# Patient Record
Sex: Female | Born: 1955 | Race: Black or African American | Hispanic: No | Marital: Single | Smoking: Never smoker
Health system: Southern US, Community
[De-identification: ages and names within clinical notes are randomized; demographics above are authoritative.]

## PROBLEM LIST (undated history)

## (undated) DIAGNOSIS — I10 Essential (primary) hypertension: Secondary | ICD-10-CM

## (undated) DIAGNOSIS — I471 Supraventricular tachycardia: Secondary | ICD-10-CM

## (undated) HISTORY — PX: BREAST SURGERY: SHX581

---

## 2017-05-28 ENCOUNTER — Emergency Department (HOSPITAL_BASED_OUTPATIENT_CLINIC_OR_DEPARTMENT_OTHER): Payer: BC Managed Care – PPO

## 2017-05-28 ENCOUNTER — Other Ambulatory Visit: Payer: Self-pay

## 2017-05-28 ENCOUNTER — Emergency Department (HOSPITAL_BASED_OUTPATIENT_CLINIC_OR_DEPARTMENT_OTHER)
Admission: EM | Admit: 2017-05-28 | Discharge: 2017-05-28 | Disposition: A | Discharge status: Home / Self Care | Payer: BC Managed Care – PPO | Attending: Emergency Medicine | Admitting: Emergency Medicine

## 2017-05-28 ENCOUNTER — Encounter (HOSPITAL_BASED_OUTPATIENT_CLINIC_OR_DEPARTMENT_OTHER): Payer: Self-pay | Admitting: *Deleted

## 2017-05-28 DIAGNOSIS — I10 Essential (primary) hypertension: Secondary | ICD-10-CM | POA: Insufficient documentation

## 2017-05-28 DIAGNOSIS — J111 Influenza due to unidentified influenza virus with other respiratory manifestations: Secondary | ICD-10-CM | POA: Insufficient documentation

## 2017-05-28 DIAGNOSIS — R05 Cough: Secondary | ICD-10-CM | POA: Diagnosis present

## 2017-05-28 DIAGNOSIS — R51 Headache: Secondary | ICD-10-CM | POA: Insufficient documentation

## 2017-05-28 HISTORY — DX: Essential (primary) hypertension: I10

## 2017-05-28 HISTORY — DX: Supraventricular tachycardia: I47.1

## 2017-05-28 LAB — INFLUENZA PANEL BY PCR (TYPE A & B)
INFLBPCR: NEGATIVE
Influenza A By PCR: POSITIVE — AB

## 2017-05-28 IMAGING — DX DG CHEST 2V
2 series · 2 of 2 positions shown · non-contrast
Comparison: None.

CLINICAL DATA: Cough, fever, and nausea and vomiting for 2 days.

EXAM:
CHEST  2 VIEW

[chest lat]
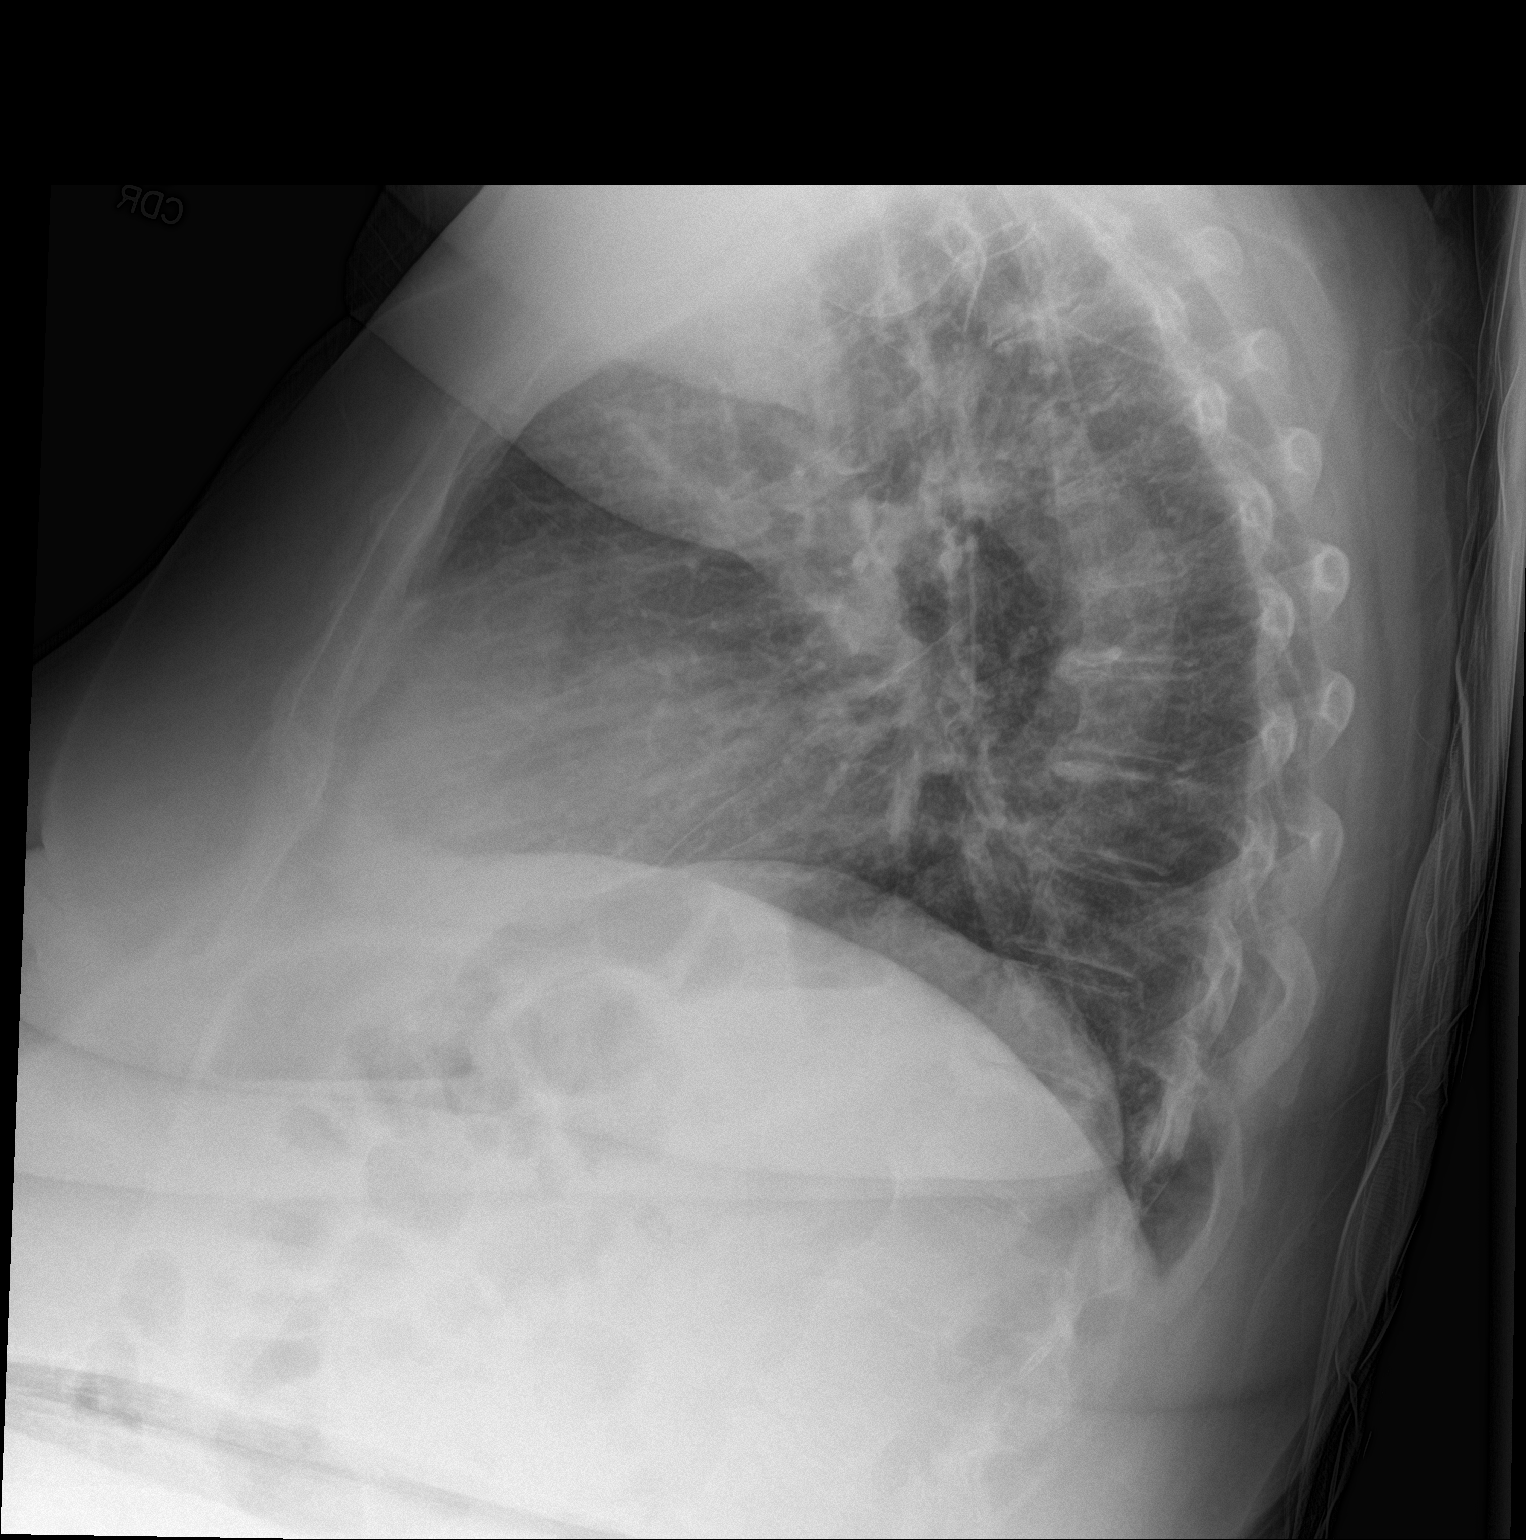

[chest ap strecther]
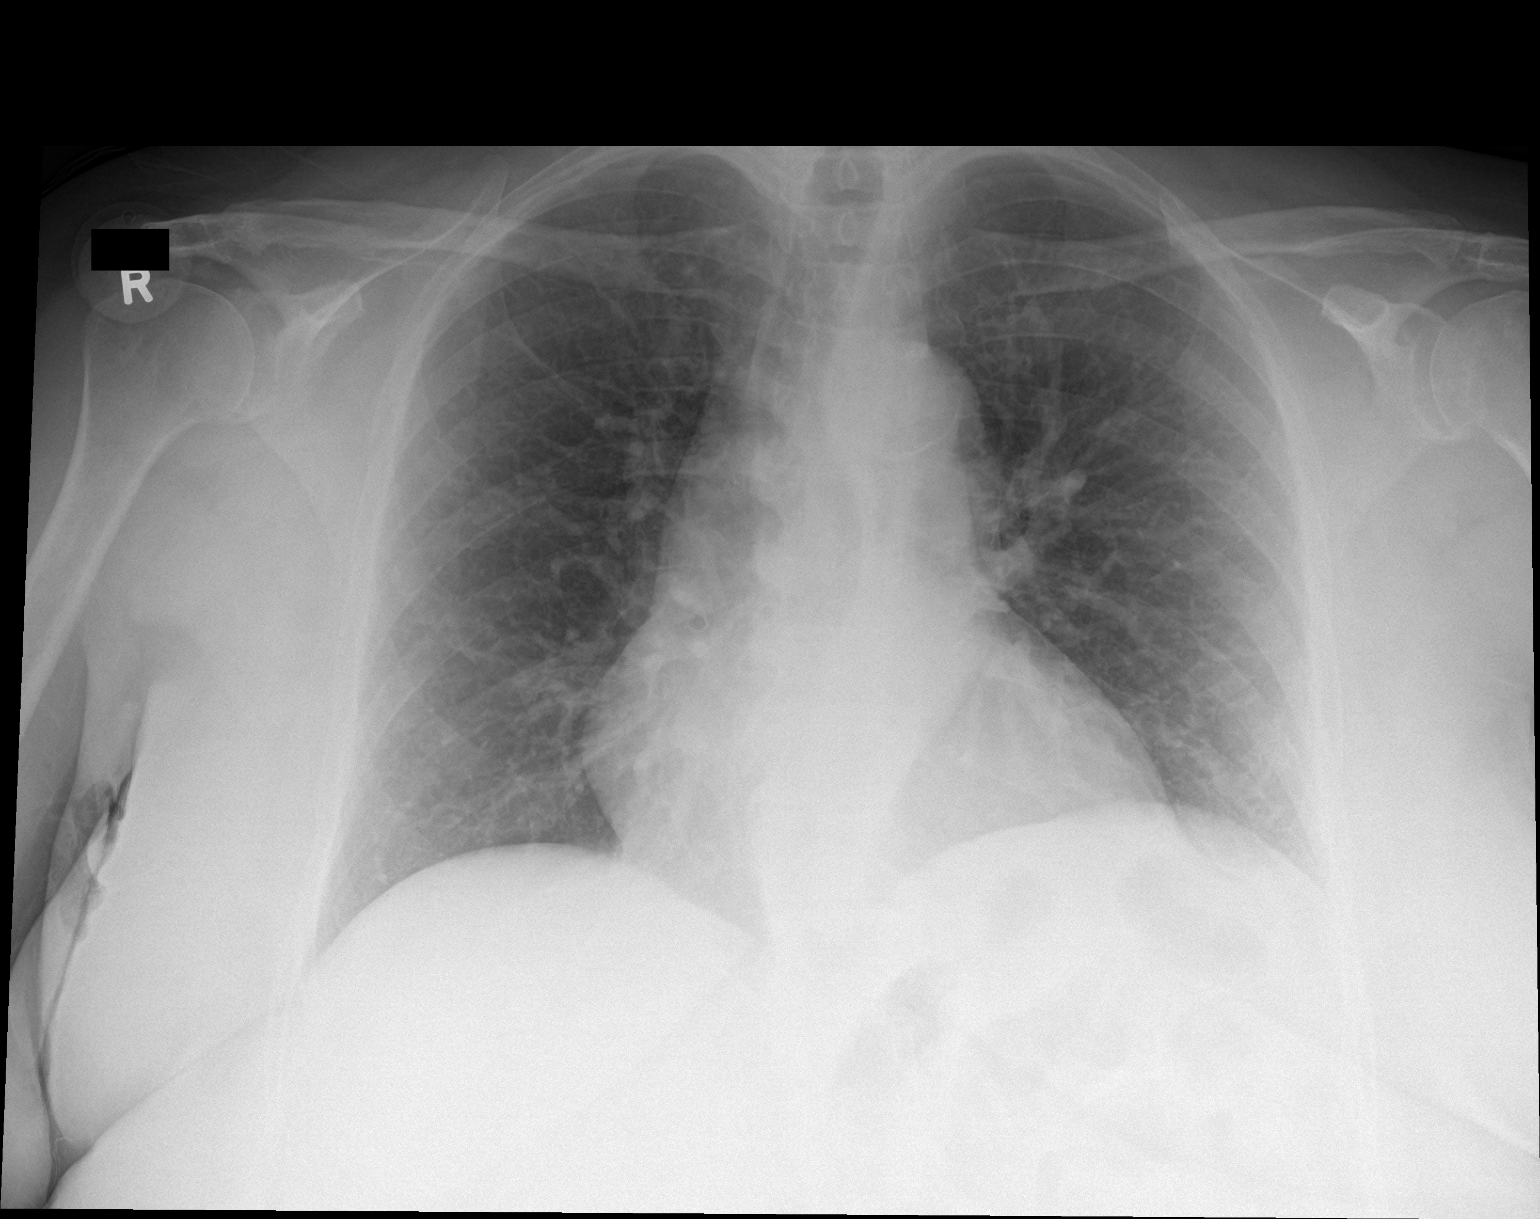

[2 of 2 positions shown; findings below may reference images not displayed]

FINDINGS: The heart size is normal. Mild tortuosity and atherosclerotic
calcification of thoracic aorta noted. Both lungs are clear. Mild
thoracic spine degenerative changes.
IMPRESSION: No active cardiopulmonary disease.

## 2017-05-28 IMAGING — CT CT HEAD W/O CM
3 series · 14 of 47 positions shown, 16 images · non-contrast
Comparison: None.

CLINICAL DATA: Headaches

EXAM:
CT HEAD WITHOUT CONTRAST
TECHNIQUE: Contiguous axial images were obtained from the base of the skull
through the vertex without intravenous contrast.

[Series 2: head wo · axial · 0.44mm/px · z∈[-167,-42]mm · 8 of 30 slices shown, 10 images]
[im 3/30  brain]
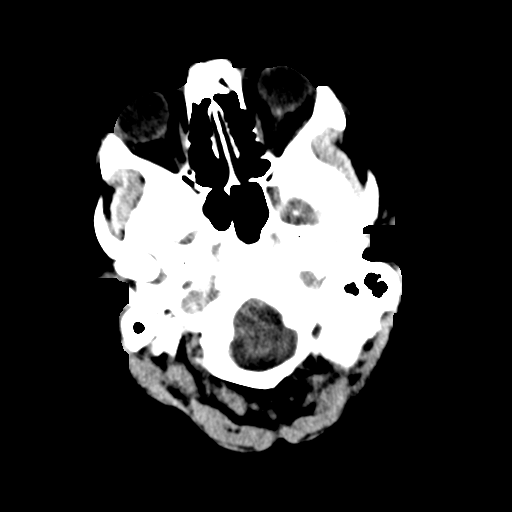
[im 3/30  bone]
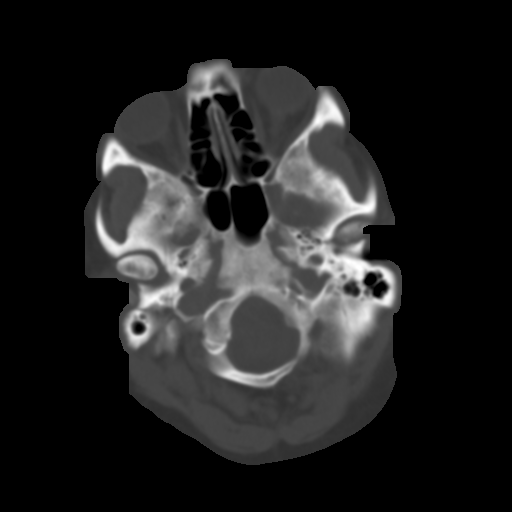
[im 7/30  brain]
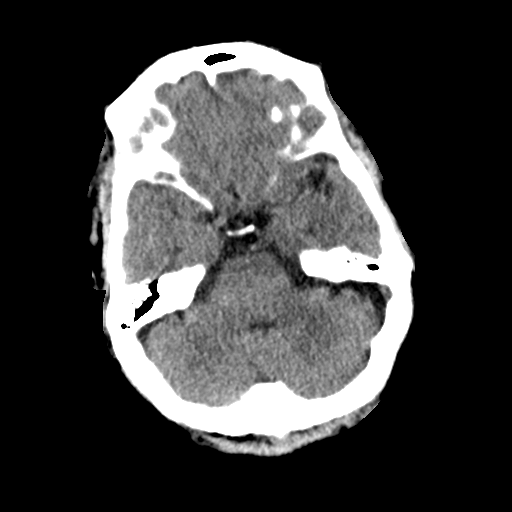
[im 10/30  brain]
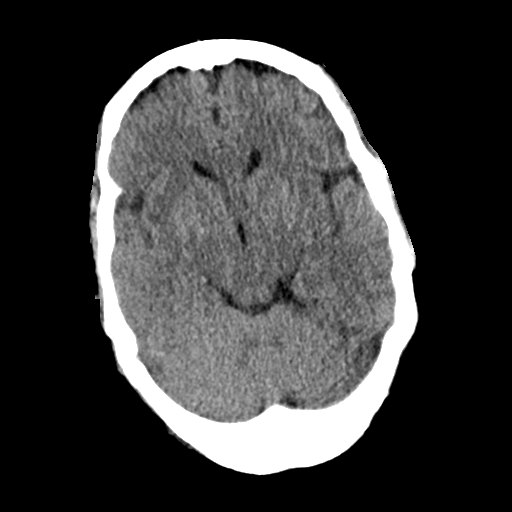
[im 14/30  brain]
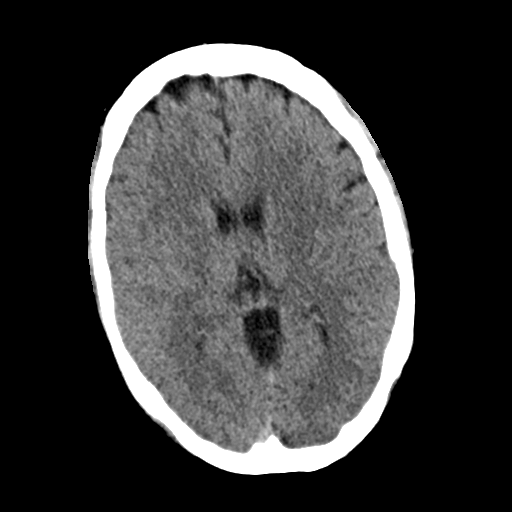
[im 17/30  brain]
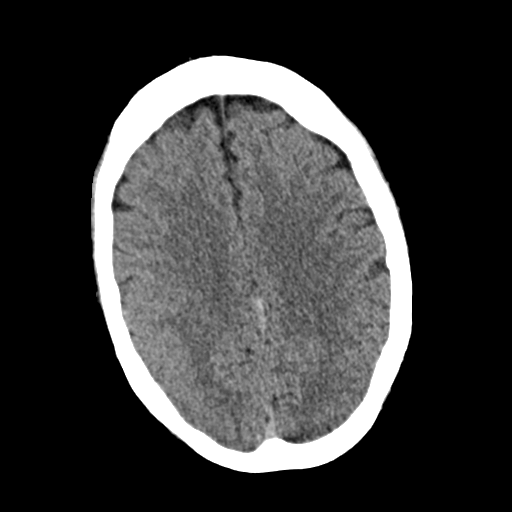
[im 17/30  bone]
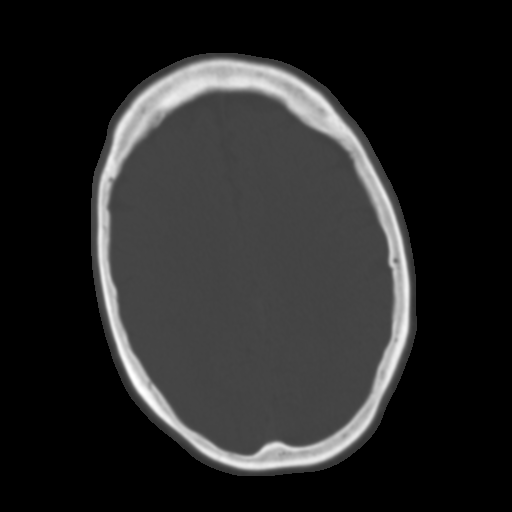
[im 21/30  brain]
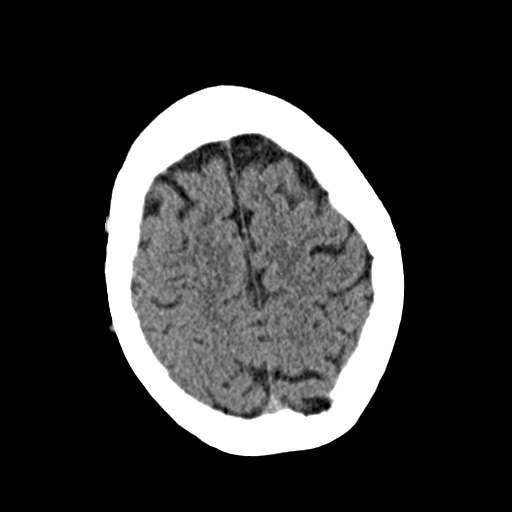
[im 24/30  brain]
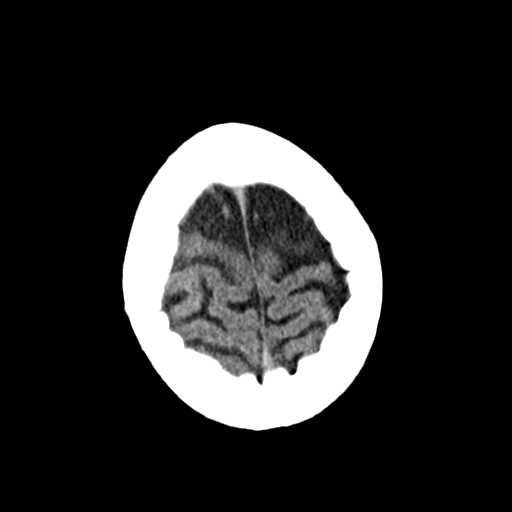
[im 28/30  brain]
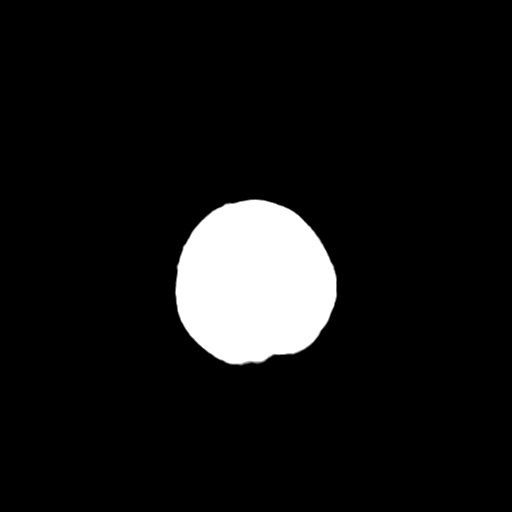

[Series 4: coronal soft · coronal · 0.29mm/px · 3 of 71 slices shown]
[im 24/71  brain]
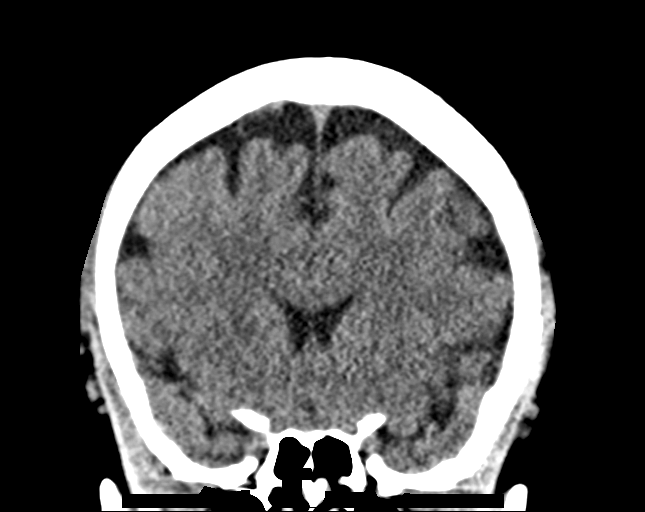
[im 32/71  brain]
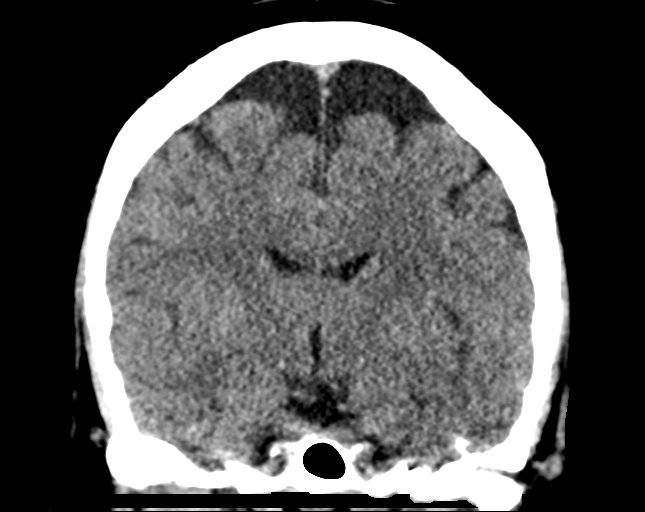
[im 39/71  brain]
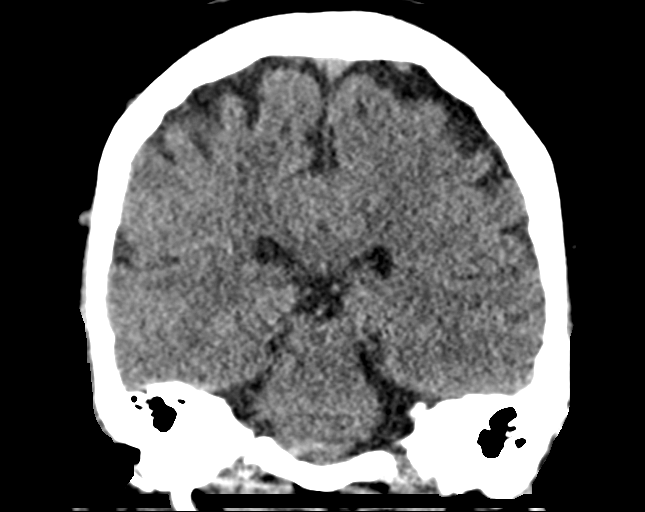

[Series 5: sag soft · sagittal · 0.29mm/px · 3 of 55 slices shown]
[im 19/55  brain]
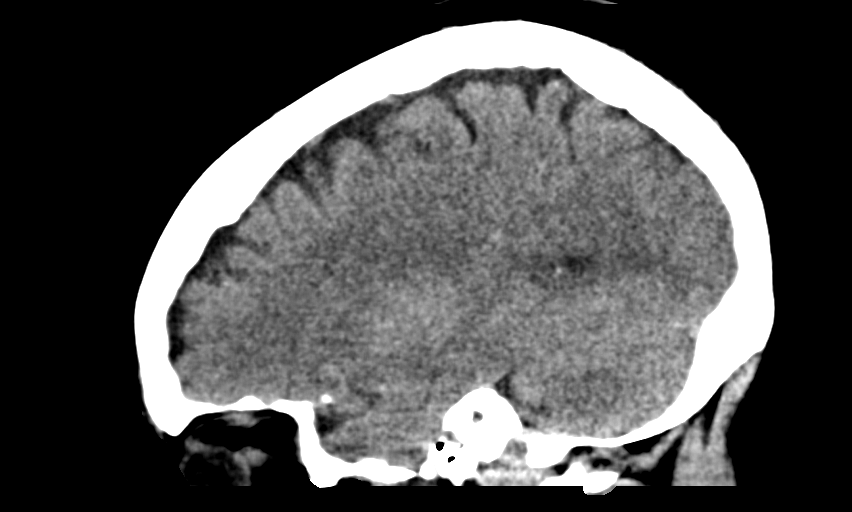
[im 28/55  brain]
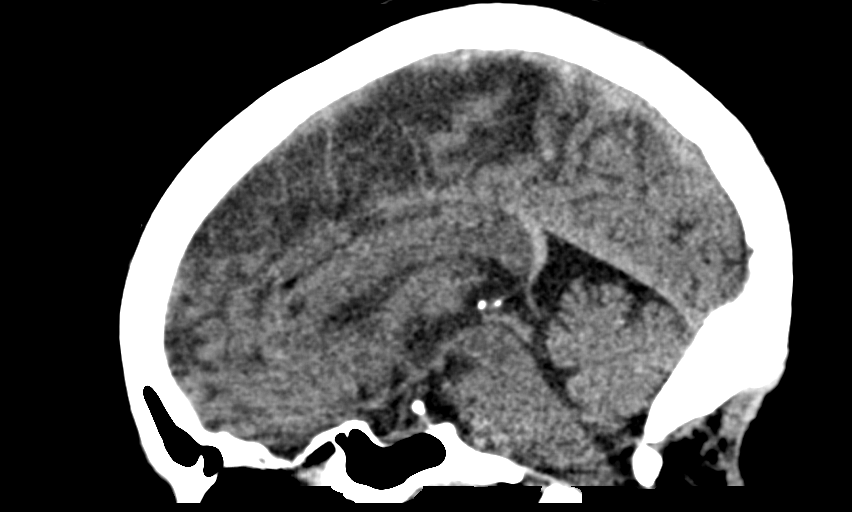
[im 37/55  brain]
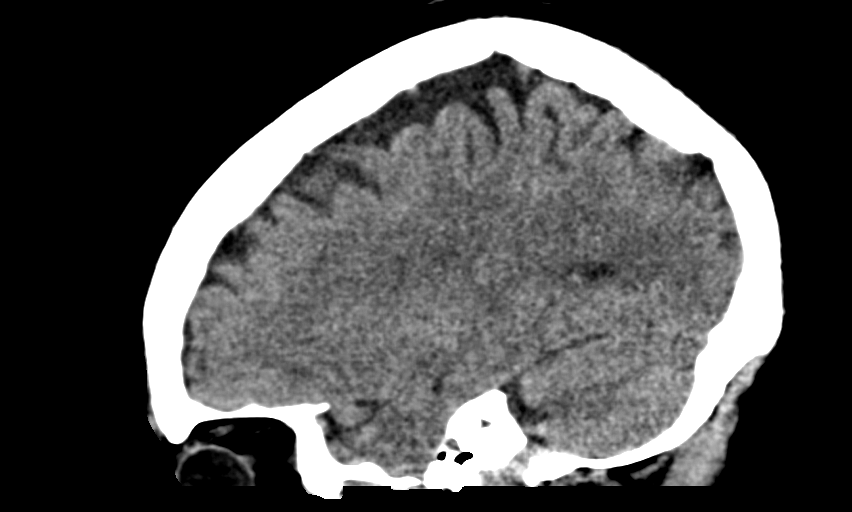

[14 of 47 positions shown; findings below may reference images not displayed]

FINDINGS: Brain: No evidence of acute infarction, hemorrhage, hydrocephalus,
extra-axial collection or mass lesion/mass effect.

Vascular: No hyperdense vessel or unexpected calcification.

Skull: Normal. Negative for fracture or focal lesion.

Sinuses/Orbits: No acute finding.

Other: None.
IMPRESSION: No acute intracranial abnormality noted.

## 2017-05-28 MED ORDER — OSELTAMIVIR PHOSPHATE 75 MG PO CAPS: 75.0000 mg | ORAL_CAPSULE | Freq: Two times a day (BID) | ORAL | 0 refills | Status: DC

## 2017-05-28 MED ORDER — ACETAMINOPHEN 325 MG PO TABS
650.0000 mg | ORAL_TABLET | Freq: Once | ORAL | Status: AC
  Administered 2017-05-28: 650 mg via ORAL
  Filled 2017-05-28: qty 2

## 2017-05-28 MED ORDER — SODIUM CHLORIDE 0.9 % IV BOLUS (SEPSIS)
1000.0000 mL | Freq: Once | INTRAVENOUS | Status: AC
  Administered 2017-05-28: 1000 mL via INTRAVENOUS

## 2017-05-28 NOTE — ED Provider Notes (Signed)
[facility] EMERGENCY DEPARTMENT Provider Note   CSN: [id number] Arrival date & time: 05/28/17  1514     History   Chief Complaint Chief Complaint  Patient presents with  . Influenza    HPI  Audrey Bennett is a 61 y.o. Female history of hypertension and SVT, who presents with flulike symptoms that started yesterday.  Patient reports yesterday she started experiencing nasal congestion, mild rhinorrhea, and cough productive of phlegm, no hemoptysis.  Pt also reports one episode of vomiting and some diarrhea, nonbloody.  Patient also complaining of a severe frontal headache, reports she has had headaches in the past, but this 1 seems worse.  Patient has not taken any medications prior to arrival to treat her symptoms, no Tylenol or ibuprofen for headache.  Patient reports one low-grade fever measured here in the ED, no fevers that she noted at home.  She denies any chest pain, shortness of breath, difficulty breathing or abdominal pain.  No vision changes, neck pain, weakness, numbness, tingling or dizziness associated with the headache.  Patient did not have her flu shot this year.  Reports her grandson was sick earlier in the week with some similar symptoms.      Past Medical History:  Diagnosis Date  . Hypertension   . SVT (supraventricular tachycardia) (HCC)     There are no active problems to display for this patient.   Past Surgical History:  Procedure Laterality Date  . BREAST SURGERY      OB History    No data available       Home Medications    Prior to Admission medications   Medication Sig Start Date End Date Taking? Authorizing Provider  diltiazem (DILACOR XR) 240 MG 24 hr capsule Take 240 mg by mouth daily.   Yes [provider]  hydrochlorothiazide (MICROZIDE) 12.5 MG capsule Take 12.5 mg by mouth daily.   Yes [provider]    Family History History reviewed. No pertinent family history.  Social History Social History     Tobacco Use  . Smoking status: Never Smoker  . Smokeless tobacco: Never Used  Substance Use Topics  . Alcohol use: No    Frequency: Never  . Drug use: No     Allergies   Patient has no known allergies.   Review of Systems Review of Systems  Constitutional: Negative for chills and fever.  HENT: Positive for congestion, postnasal drip, rhinorrhea and sinus pressure. Negative for ear discharge, ear pain, sore throat and trouble swallowing.   Eyes: Negative for discharge, redness and itching.  Respiratory: Positive for cough. Negative for chest tightness, shortness of breath, wheezing and stridor.   Cardiovascular: Negative for chest pain and palpitations.  Gastrointestinal: Positive for diarrhea and vomiting. Negative for abdominal pain, blood in stool and nausea.  Genitourinary: Negative for dysuria.  Skin: Negative for rash.  Neurological: Positive for headaches. Negative for dizziness, facial asymmetry, weakness, light-headedness and numbness.     Physical Exam Updated Vital Signs Pulse (!) 106   Temp 99.6 F (37.6 C) (Oral)   Resp 18   Ht 5\' 1"  (1.549 m)   Wt 77.1 kg (170 lb)   SpO2 98%   BMI 32.12 kg/m   Physical Exam  Constitutional: She is oriented to person, place, and time. She appears well-developed and well-nourished. No distress.  HENT:  Head: Normocephalic and atraumatic.  TMs clear with good landmarks, moderate nasal mucosa edema with clear rhinorrhea, posterior oropharynx clear and moist, with some  erythema, no edema or exudates, uvula midline, no trismus  Eyes: EOM are normal. Pupils are equal, round, and reactive to light. Right eye exhibits no discharge. Left eye exhibits no discharge.  No proptosis  Neck: Normal range of motion. Neck supple.  Full active ROM, no rigidity  Cardiovascular: Normal rate, regular rhythm, normal heart sounds and intact distal pulses.  Pulmonary/Chest: Effort normal and breath sounds normal. No stridor. No respiratory  distress. She has no wheezes. She has no rales.  Abdominal: Soft. Bowel sounds are normal. She exhibits no distension and no mass. There is no tenderness. There is no guarding.  Musculoskeletal: She exhibits no edema or deformity.  Neurological: She is alert and oriented to person, place, and time. Coordination normal.  Speech is clear, able to follow commands CN III-XII intact Normal strength in upper and lower extremities bilaterally including dorsiflexion and plantar flexion, strong and equal grip strength Sensation normal to light and sharp touch Moves extremities without ataxia, coordination intact Normal finger to nose and rapid alternating movements  Skin: Skin is warm and dry. Capillary refill takes less than 2 seconds. She is not diaphoretic.  Psychiatric: She has a normal mood and affect. Her behavior is normal.  Nursing note and vitals reviewed.    ED Treatments / Results  Labs (all labs ordered are listed, but only abnormal results are displayed) Labs Reviewed  INFLUENZA PANEL BY PCR (TYPE A & B) - Abnormal; Notable for the following components:      Result Value   Influenza A By PCR POSITIVE (*)    All other components within normal limits    EKG  EKG Interpretation None       Radiology Dg Chest 2 View  Result Date: 05/28/2017 CLINICAL DATA:  Cough, fever, and nausea and vomiting for 2 days. EXAM: CHEST  2 VIEW COMPARISON:  None. FINDINGS: The heart size is normal. Mild tortuosity and atherosclerotic calcification of thoracic aorta noted. Both lungs are clear. Mild thoracic spine degenerative changes. IMPRESSION: No active cardiopulmonary disease. Electronically Signed   By: Myles Rosenthal M.D.   On: 05/28/2017 16:52   Ct Head Wo Contrast  Result Date: 05/28/2017 CLINICAL DATA:  Headaches EXAM: CT HEAD WITHOUT CONTRAST TECHNIQUE: Contiguous axial images were obtained from the base of the skull through the vertex without intravenous contrast. COMPARISON:  None.  FINDINGS: Brain: No evidence of acute infarction, hemorrhage, hydrocephalus, extra-axial collection or mass lesion/mass effect. Vascular: No hyperdense vessel or unexpected calcification. Skull: Normal. Negative for fracture or focal lesion. Sinuses/Orbits: No acute finding. Other: None. IMPRESSION: No acute intracranial abnormality noted. Electronically Signed   By: Alcide Clever M.D.   On: 05/28/2017 17:17    Procedures Procedures (including critical care time)  Medications Ordered in ED Medications  sodium chloride 0.9 % bolus 1,000 mL (0 mLs Intravenous Stopped 05/28/17 1928)  acetaminophen (TYLENOL) tablet 650 mg (650 mg Oral Given 05/28/17 1622)     Initial Impression / Assessment and Plan / ED Course  I have reviewed the triage vital signs and the nursing notes.  Pertinent labs & imaging results that were available during my care of the patient were reviewed by me and considered in my medical decision making (see chart for details).  Patient presents with flulike symptoms that started yesterday and severe frontal headache.  Nothing prior to arrival to treat symptoms.  On exam low-grade fever and mild tachycardia, vitals otherwise normal.  No neurologic deficits, but given that this headache is more severe  than her typical headache and patient appears very uncomfortable will get head CT to rule out any acute intracranial abnormalities.  Patient with nasal congestion and some sinus tenderness, and productive cough, patient would like to have flu swab, will also get chest x-ray to rule out pneumonia.  Fluids and Tylenol and will reassess the patient.  CT head w/o acute abnormality, CXR w/o evidence of PNA of other active cardiopulmonary disease. On re-eval pt reports improvement in headache after tylenol and fluids. Vitals normal and pt in NAD. Discussed the cost versus benefit of Tamiflu treatment with the patient.  Pt is within the 48 hr window, after shared decision making discussion pt  wants tamiflu, Tatient will be discharged with instructions to orally hydrate, rest, and use over-the-counter medications such as anti-inflammatories ibuprofen and Aleve for muscle aches and Tylenol for fever. Prescription for tamiflu provided, side effects discussed. Pt to follow up with PCP next week. Return precautions provided. Pt expresses understanding and agrees with plan. . Final Clinical Impressions(s) / ED Diagnoses   Final diagnoses:  Influenza    ED Discharge Orders        Ordered    oseltamivir (TAMIFLU) 75 MG capsule  Every 12 hours     05/28/17 1904       Dartha LodgeFord, Azaan Leask N, [place]PA-C 05/29/17 [id number]    Alvira MondaySchlossman, Erin, MD 05/30/17 [phone]

## 2017-05-28 NOTE — Discharge Instructions (Signed)
Your flu test was positive today, this is likely causing your symptoms.  Your chest x-ray showed no evidence of pneumonia.  Been prescribed Tamiflu, which you should take twice daily for the next 5 days, as we discussed this can cause some upset stomach, vomiting or diarrhea.  Continue to drink lots of fluids, ibuprofen and Tylenol for aches and pains as well as fever, over-the-counter medications to support your symptoms such as cough syrups or throat lozenges.  It is very important that you follow-up with your primary doctor next week so she can ensure that you are improving and not developing any complications of the flu.  If you develop persistent fevers, nausea and vomiting and or unable to keep down fluids, chest pain, shortness of breath or difficulty breathing or other new or concerning symptoms please return to the emergency department.

## 2017-05-28 NOTE — ED Notes (Signed)
Pt amb to BR

## 2017-05-28 NOTE — ED Notes (Signed)
Pt in radiology at this time. 

## 2017-05-28 NOTE — ED Triage Notes (Signed)
Pt c/o flu like symptoms x 2 day , fever, h/a congestion , n/v/d

## 2017-05-28 NOTE — ED Notes (Signed)
Pt and family given d/c instructions as per chart. Rx x 1. Verbalizes understanding. No questions. 

## 2017-05-28 NOTE — ED Notes (Signed)
ED Provider at bedside. 

## 2017-08-18 ENCOUNTER — Encounter (HOSPITAL_COMMUNITY): Payer: Self-pay | Admitting: Emergency Medicine

## 2017-08-18 ENCOUNTER — Emergency Department (HOSPITAL_COMMUNITY)
Admission: EM | Admit: 2017-08-18 | Discharge: 2017-08-18 | Disposition: A | Discharge status: Home / Self Care | Payer: BC Managed Care – PPO | Attending: Emergency Medicine | Admitting: Emergency Medicine

## 2017-08-18 DIAGNOSIS — R55 Syncope and collapse: Secondary | ICD-10-CM | POA: Insufficient documentation

## 2017-08-18 DIAGNOSIS — I10 Essential (primary) hypertension: Secondary | ICD-10-CM | POA: Insufficient documentation

## 2017-08-18 MED ORDER — SODIUM CHLORIDE 0.9 % IV BOLUS (SEPSIS): 500.0000 mL | Freq: Once | INTRAVENOUS | Status: DC

## 2017-08-18 NOTE — ED Provider Notes (Signed)
Emergency Department Provider Note   I have reviewed the triage vital signs and the nursing notes.   HISTORY  Chief Complaint Loss of Consciousness   HPI Audrey Bennett is a 62 y.o. female with PMH of HTN and SVT presents to the emergency department for evaluation of syncope x 2 today.  The patient was in the intensive care unit visiting her son who passed away this evening.  Family states that she was being told the news when she suddenly lost consciousness for a brief period of time without seizure activity.  She was in significant grief at the time and then had a second episode of syncope while in the bathroom shortly afterwards.  Patient denies any chest pain or heart palpitations.  She does have a history of SVT.  She has been compliant with her medications.  The family was around her during both events and reports that she did not strike her head or fall.    Past Medical History:  Diagnosis Date  . Hypertension   . SVT (supraventricular tachycardia) (HCC)     There are no active problems to display for this patient.   Past Surgical History:  Procedure Laterality Date  . BREAST SURGERY      Current Outpatient Rx  . Order #: [id number] Class: Historical Med  . Order #: [id number] Class: Historical Med  . Order #: [id number] Class: Print    Allergies Patient has no known allergies.  History reviewed. No pertinent family history.  Social History Social History   Tobacco Use  . Smoking status: Never Smoker  . Smokeless tobacco: Never Used  Substance Use Topics  . Alcohol use: No    Frequency: Never  . Drug use: No    Review of Systems  Constitutional: No fever/chills Eyes: No visual changes. ENT: No sore throat. Cardiovascular: Denies chest pain. Positive syncope.  Respiratory: Denies shortness of breath. Gastrointestinal: No abdominal pain.  No nausea, no vomiting.  No diarrhea.  No constipation. Genitourinary: Negative for dysuria. Musculoskeletal:  Negative for back pain. Skin: Negative for rash. Neurological: Negative for headaches, focal weakness or numbness.  10-point ROS otherwise negative.  ____________________________________________   PHYSICAL EXAM:  VITAL SIGNS: Vitals:   08/18/17 2245 08/18/17 2255  BP: (!) 145/68 (!) 145/66  Pulse: 87 81  Resp:  18  Temp:  98.3 F (36.8 C)  SpO2: 97% 98%   Constitutional: Alert and oriented. Well appearing and in no acute distress. Eyes: Conjunctivae are normal.  Head: Atraumatic. Nose: No congestion/rhinnorhea. Mouth/Throat: Mucous membranes are moist.  Neck: No stridor.  Cardiovascular: Normal rate, regular rhythm. Good peripheral circulation. Grossly normal heart sounds.   Respiratory: Normal respiratory effort.  No retractions. Lungs CTAB. Gastrointestinal: Soft and nontender. No distention.  Musculoskeletal: No lower extremity tenderness nor edema. No gross deformities of extremities. Neurologic:  Normal speech and language. No gross focal neurologic deficits are appreciated.  Skin:  Skin is warm, dry and intact. No rash noted. ____________________________________________   LABS (all labs ordered are listed, but only abnormal results are displayed)  Patient refused  ____________________________________________  EKG  Patient refused ____________________________________________  RADIOLOGY  None ____________________________________________   PROCEDURES  Procedure(s) performed:   Procedures  None ____________________________________________   INITIAL IMPRESSION / ASSESSMENT AND PLAN / ED COURSE  Pertinent labs & imaging results that were available during my care of the patient were reviewed by me and considered in my medical decision making (see chart for details).  Patient presents to the emergency department for evaluation  of syncope.  She learned this evening that her son had passed away in the intensive care unit.  Family reports she was overcome  with grief and proceeded to have 2 separate episodes of syncope without any head injury or seizure activity.  The patient is awake and alert on my evaluation.  She would not allow me to draw blood or obtain an EKG. she did allow me to take orthostatic vital signs which were reviewed and unremarkable.  She was monitored in the emergency department for a brief period of time with no acute symptoms.  I suspect that this is an acute grief reaction but again tried to have her let me perform an EKG. she refused again.  I discussed that if she has any additional syncope or presyncope symptoms she needs to immediately return to the emergency department.  At this time I feels most appropriate to discharge the patient so that she can be with her family and continue to grieve for her son.    ____________________________________________  FINAL CLINICAL IMPRESSION(S) / ED DIAGNOSES  Final diagnoses:  Syncope and collapse     MEDICATIONS GIVEN DURING THIS VISIT:  Medications  sodium chloride 0.9 % bolus 500 mL (has no administration in time range)    Note:  This document was prepared using Dragon voice recognition software and may include unintentional dictation errors.  Alona BeneJoshua Danielys Madry, MD Emergency Medicine    Annis Lagoy, Arlyss RepressJoshua G, MD 08/18/17 [phone]

## 2017-08-18 NOTE — ED Triage Notes (Signed)
  Patient lost her son this evening and had syncopal episode on the unit.  Patient was brought down to ED in wheelchair by rapid response RN.  Patient has history of SVT and HTN

## 2017-08-18 NOTE — Discharge Instructions (Addendum)
You were see in the ED today after passing out. Please return to the ED immediately if you develop any new or worsening symptoms. Call the cardiologist listed for follow up. We are very sorry for your loss.

## 2017-08-18 NOTE — ED Notes (Signed)
Writer attempted to collect blood work, pt stated she does not want blood work done

## 2019-02-22 ENCOUNTER — Other Ambulatory Visit: Payer: Self-pay

## 2019-02-22 ENCOUNTER — Encounter (HOSPITAL_COMMUNITY): Payer: Self-pay

## 2019-02-22 ENCOUNTER — Emergency Department (HOSPITAL_COMMUNITY): Payer: No Typology Code available for payment source

## 2019-02-22 ENCOUNTER — Emergency Department (HOSPITAL_COMMUNITY)
Admission: EM | Admit: 2019-02-22 | Discharge: 2019-02-22 | Disposition: A | Discharge status: Home / Self Care | Payer: No Typology Code available for payment source | Attending: Emergency Medicine | Admitting: Emergency Medicine

## 2019-02-22 DIAGNOSIS — I10 Essential (primary) hypertension: Secondary | ICD-10-CM | POA: Insufficient documentation

## 2019-02-22 DIAGNOSIS — Y939 Activity, unspecified: Secondary | ICD-10-CM | POA: Diagnosis not present

## 2019-02-22 DIAGNOSIS — Z79899 Other long term (current) drug therapy: Secondary | ICD-10-CM | POA: Insufficient documentation

## 2019-02-22 DIAGNOSIS — M25512 Pain in left shoulder: Secondary | ICD-10-CM | POA: Diagnosis not present

## 2019-02-22 DIAGNOSIS — W010XXA Fall on same level from slipping, tripping and stumbling without subsequent striking against object, initial encounter: Secondary | ICD-10-CM | POA: Diagnosis not present

## 2019-02-22 DIAGNOSIS — Y9289 Other specified places as the place of occurrence of the external cause: Secondary | ICD-10-CM | POA: Insufficient documentation

## 2019-02-22 DIAGNOSIS — Y990 Civilian activity done for income or pay: Secondary | ICD-10-CM | POA: Insufficient documentation

## 2019-02-22 IMAGING — DX DG SHOULDER 2+V*L*
3 series · 3 of 3 positions shown · non-contrast
Comparison: None.

CLINICAL DATA: Left shoulder pain after fall.

EXAM:
LEFT SHOULDER - 2+ VIEW

[shoulder grashey]
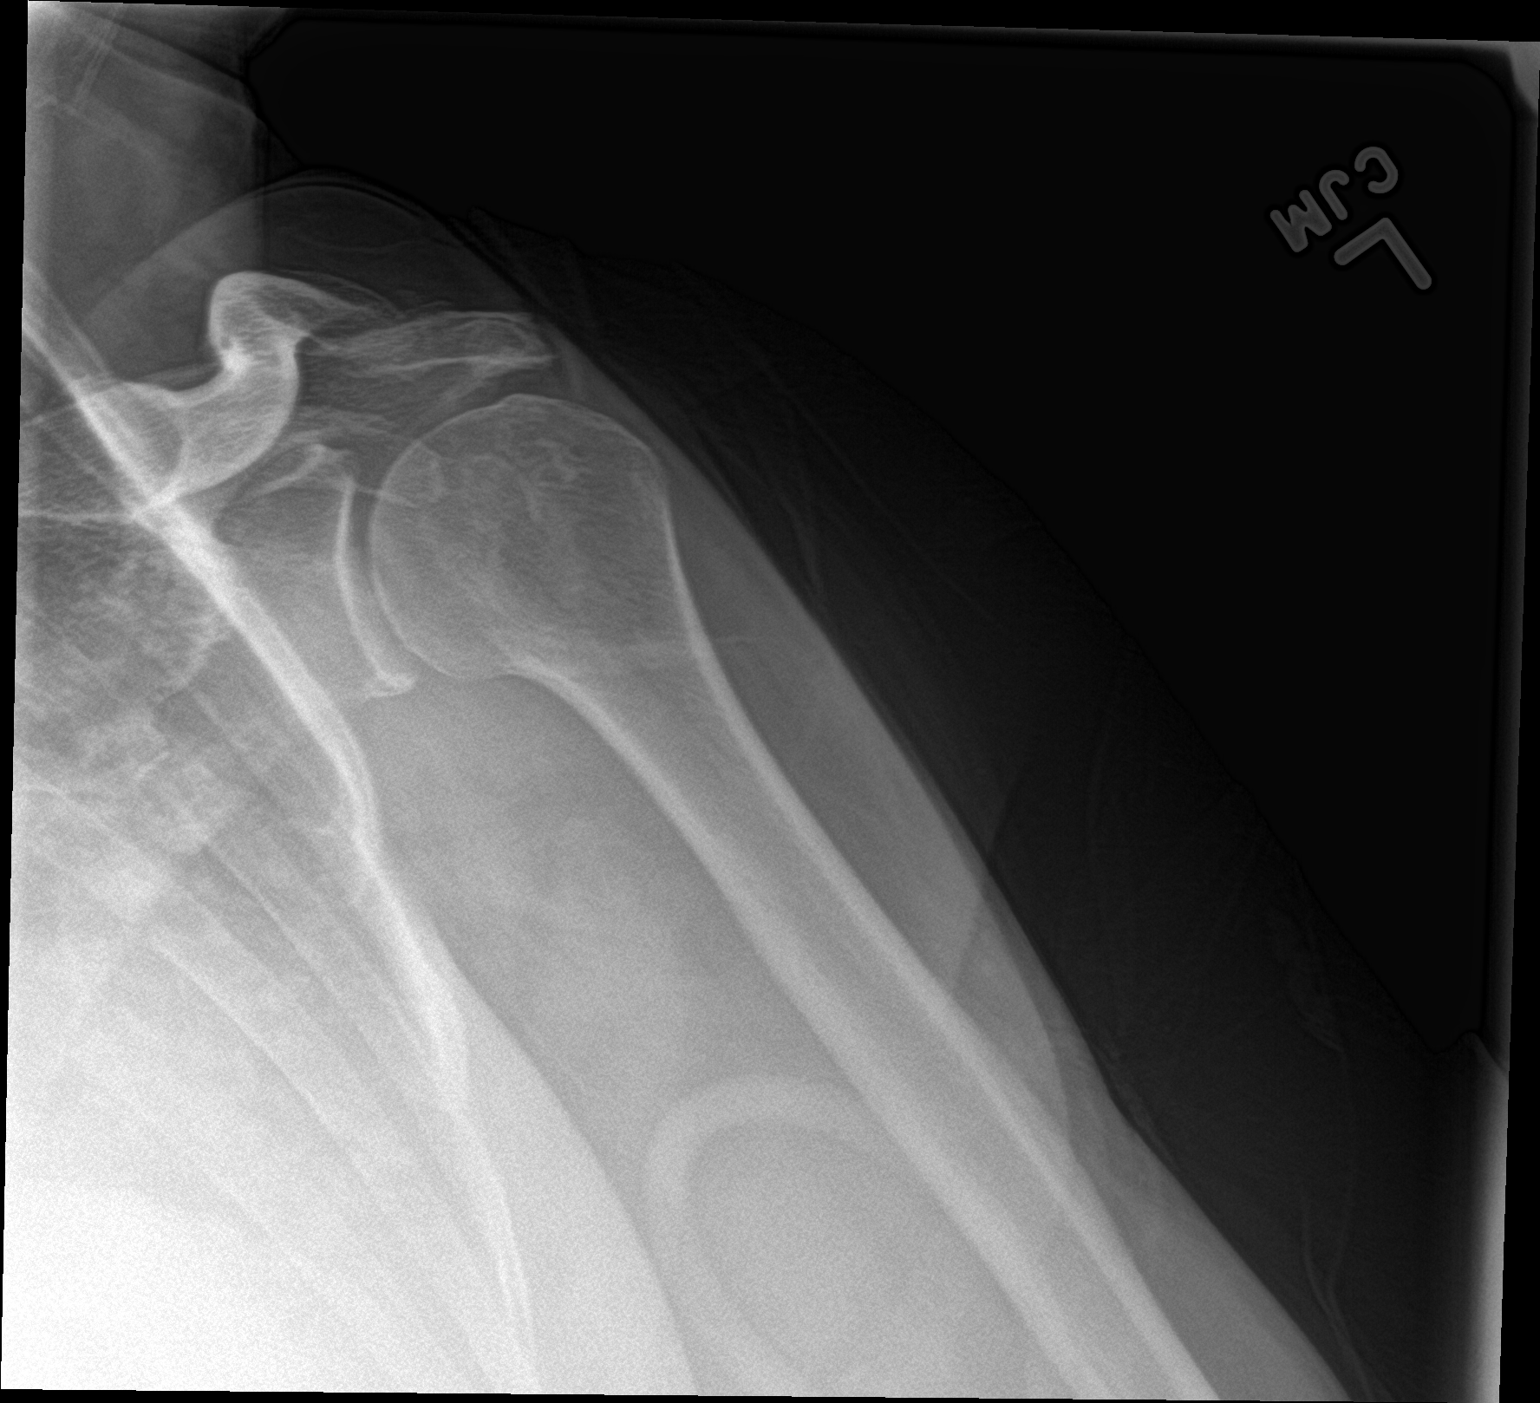

[shoulder y view]
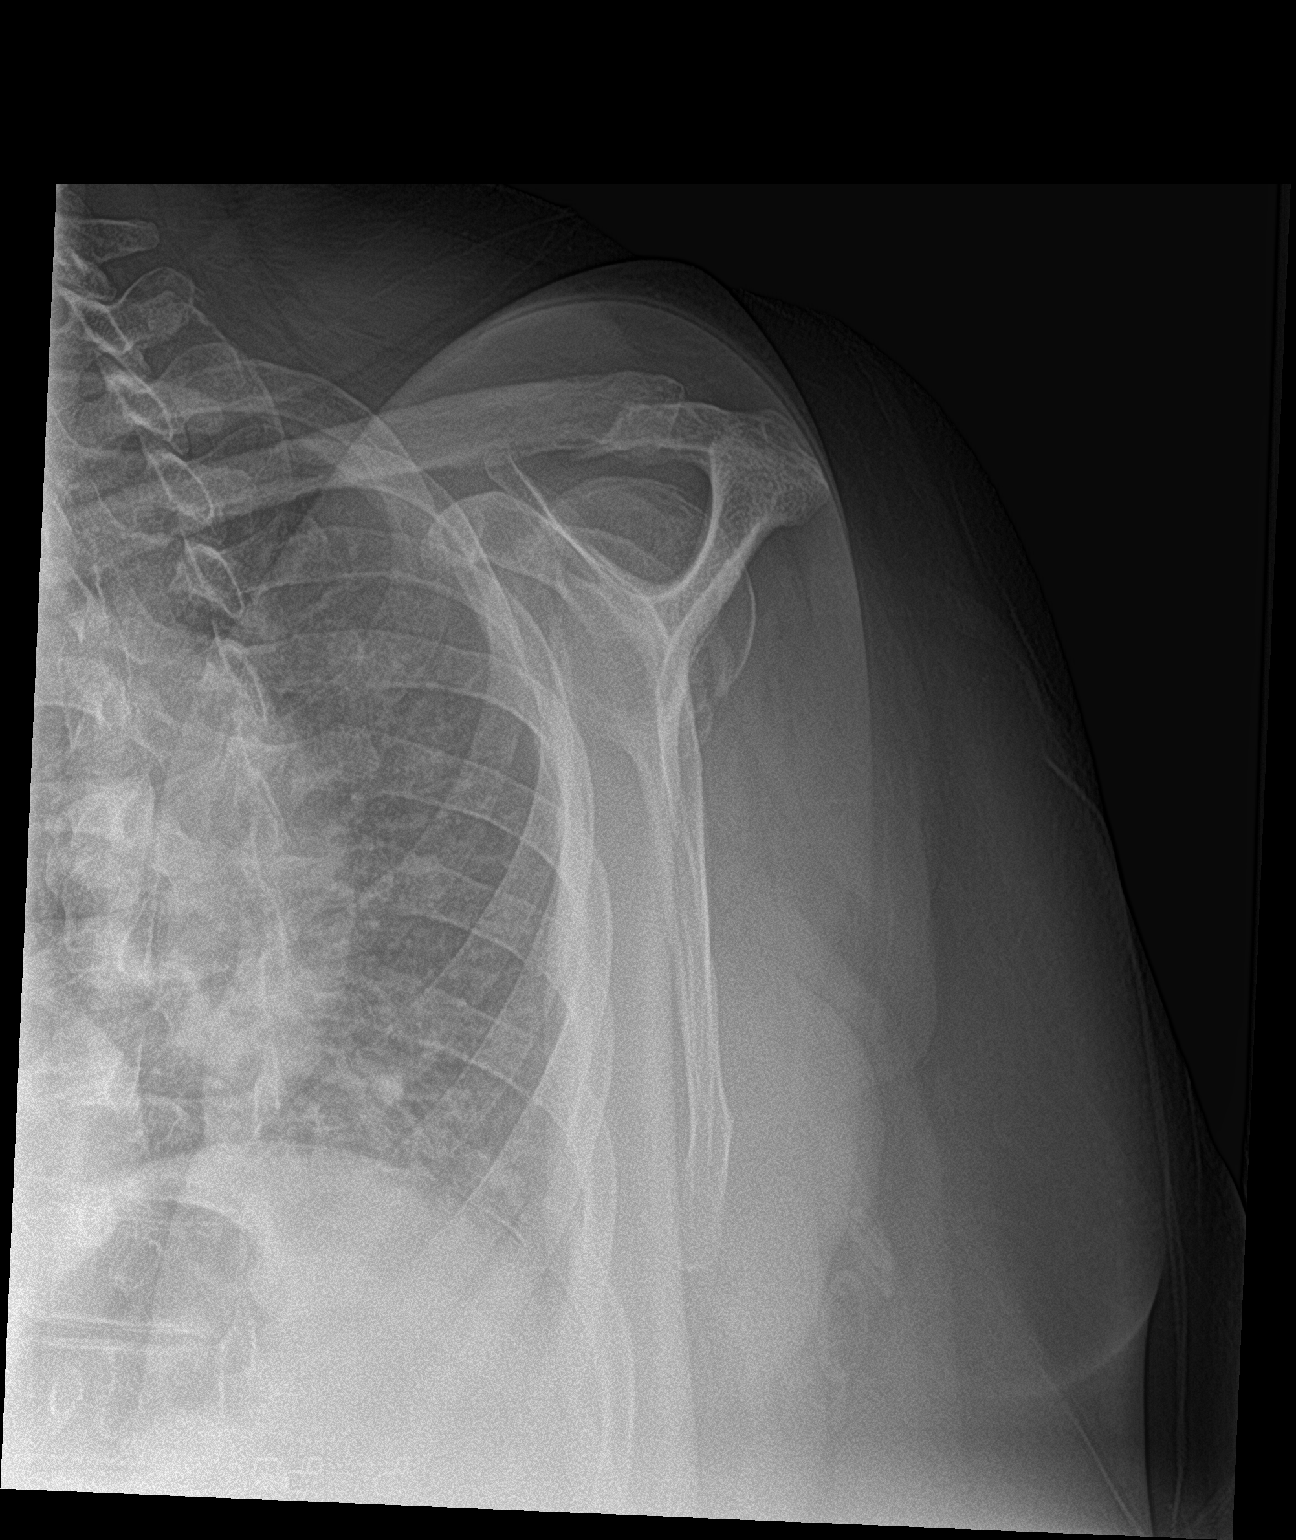

[shoulder ap neutral]
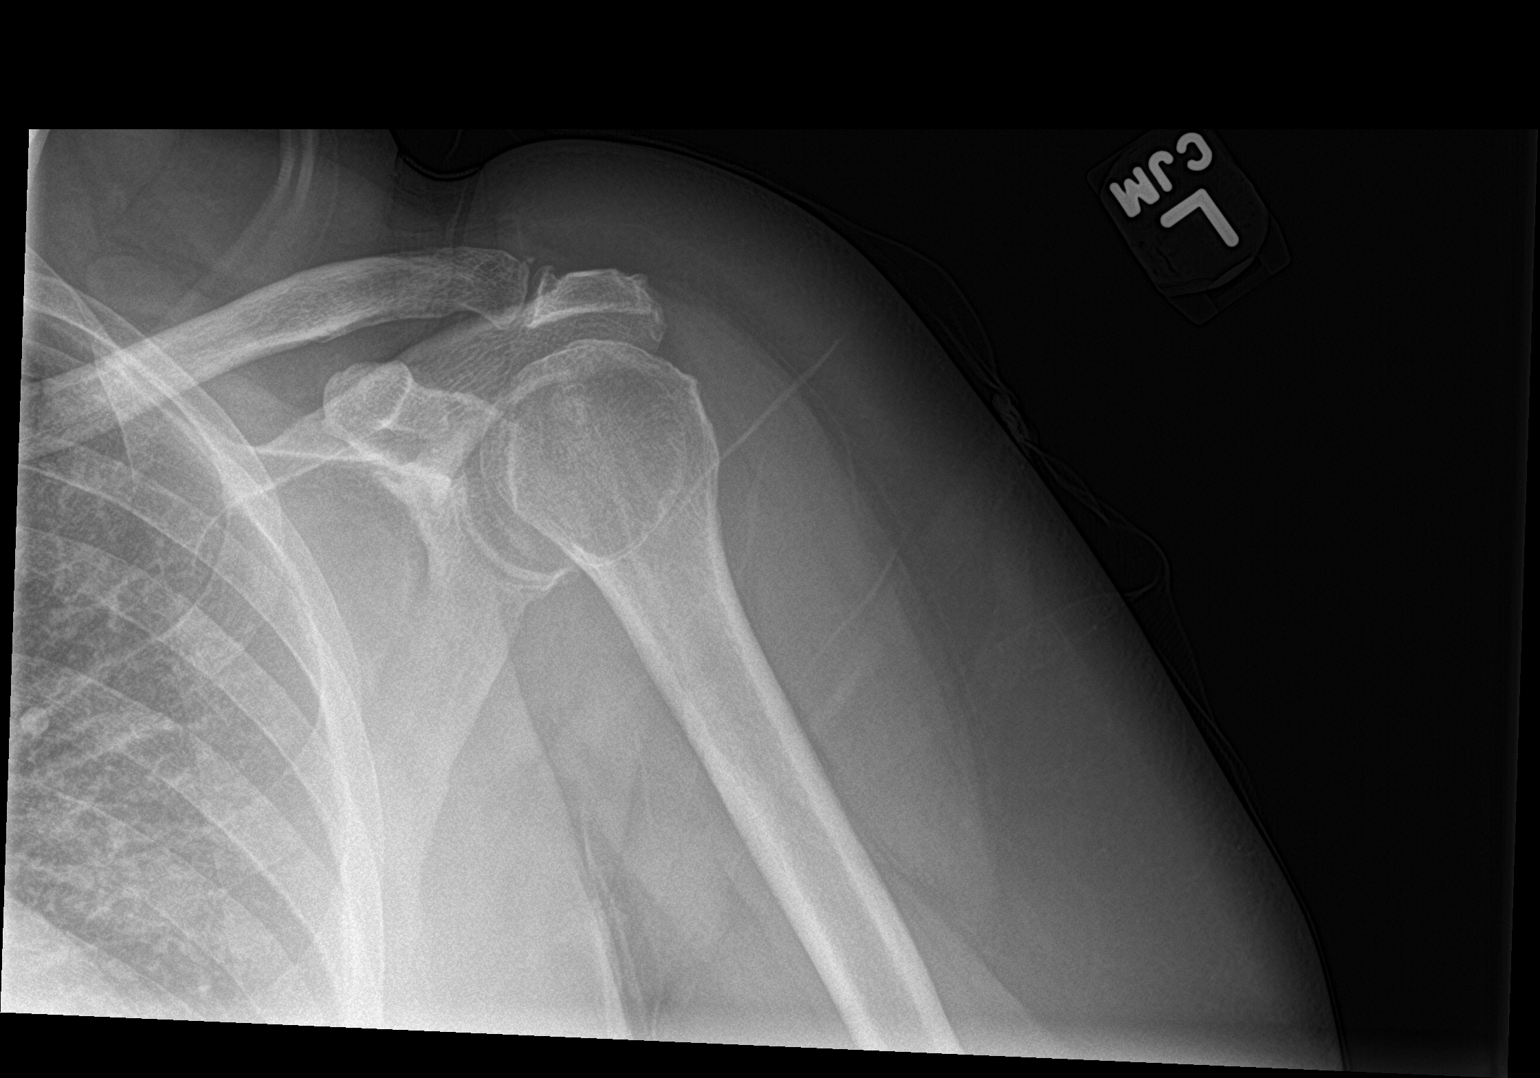

[3 of 3 positions shown; findings below may reference images not displayed]

FINDINGS: There is no evidence of fracture or dislocation. Mild degenerative
changes of the glenohumeral and acromioclavicular joints. Soft
tissues are unremarkable.
IMPRESSION: Negative.

## 2019-02-22 MED ORDER — ACETAMINOPHEN 325 MG PO TABS
650.0000 mg | ORAL_TABLET | Freq: Once | ORAL | Status: AC
  Administered 2019-02-22: 650 mg via ORAL
  Filled 2019-02-22: qty 2

## 2019-02-22 NOTE — ED Notes (Signed)
Pt not tolerating sling- prefers arm in down position dangling.

## 2019-02-22 NOTE — ED Notes (Signed)
Sling applied in triage with ice pack

## 2019-02-22 NOTE — ED Triage Notes (Signed)
Pt from work with ems for ground level fall, landed on left shoulder, no obvious deformity, tender to touch. No LOC. Pt a.o, ambulatory

## 2019-02-22 NOTE — Discharge Instructions (Signed)
Use sling for comfort.  Take 800 mg of Motrin every 8 hours as needed for pain.  Take 1000 mg of Tylenol every 6 hours as needed for pain.  Follow-up with your primary care doctor if pain persists.

## 2019-02-22 NOTE — ED Provider Notes (Signed)
[facility] EMERGENCY DEPARTMENT Provider Note   CSN: [id number] Arrival date & time: 02/22/19  8315     History   Chief Complaint Chief Complaint  Patient presents with  . Fall    HPI Audrey Bennett is a 63 y.o. female.     The history is provided by the patient.  Shoulder Pain Location:  Shoulder Shoulder location:  L shoulder Injury: yes   Pain details:    Quality:  Aching   Radiates to:  Does not radiate   Severity:  Mild   Onset quality:  Sudden   Timing:  Intermittent   Progression:  Waxing and waning Relieved by:  Immobilization Worsened by:  Movement Associated symptoms: decreased range of motion (due to pain )   Associated symptoms: no back pain, no fever, no muscle weakness, no neck pain, no numbness, no stiffness, no swelling and no tingling     Past Medical History:  Diagnosis Date  . Hypertension   . SVT (supraventricular tachycardia) (HCC)     There are no active problems to display for this patient.   Past Surgical History:  Procedure Laterality Date  . BREAST SURGERY       OB History   No obstetric history on file.      Home Medications    Prior to Admission medications   Medication Sig Start Date End Date Taking? Authorizing Provider  diltiazem (DILACOR XR) 240 MG 24 hr capsule Take 240 mg by mouth daily.    [provider]  hydrochlorothiazide (MICROZIDE) 12.5 MG capsule Take 12.5 mg by mouth daily.    [provider]  oseltamivir (TAMIFLU) 75 MG capsule Take 1 capsule (75 mg total) by mouth every 12 (twelve) hours. 05/28/17   Dartha Lodge, PA-C    Family History No family history on file.  Social History Social History   Tobacco Use  . Smoking status: Never Smoker  . Smokeless tobacco: Never Used  Substance Use Topics  . Alcohol use: No    Frequency: Never  . Drug use: No     Allergies   Patient has no known allergies.   Review of Systems Review of Systems  Constitutional:  Negative for chills and fever.  HENT: Negative for ear pain and sore throat.   Eyes: Negative for pain and visual disturbance.  Respiratory: Negative for cough and shortness of breath.   Cardiovascular: Negative for chest pain and palpitations.  Gastrointestinal: Negative for abdominal pain and vomiting.  Genitourinary: Negative for dysuria and hematuria.  Musculoskeletal: Positive for arthralgias. Negative for back pain, neck pain and stiffness.  Skin: Negative for color change and rash.  Neurological: Negative for seizures and syncope.  All other systems reviewed and are negative.    Physical Exam Updated Vital Signs  ED Triage Vitals  Enc Vitals Group     BP 02/22/19 1000 (!) 176/107     Pulse Rate 02/22/19 1000 90     Resp 02/22/19 1000 20     Temp 02/22/19 1000 98.6 F (37 C)     Temp Source 02/22/19 1000 Oral     SpO2 02/22/19 0956 96 %     Weight --      Height --      Head Circumference --      Peak Flow --      Pain Score 02/22/19 0958 10     Pain Loc --      Pain Edu? --  Excl. in [place]? --     Physical Exam Constitutional:      General: She is not in acute distress.    Appearance: She is not ill-appearing.  HENT:     Head: Normocephalic and atraumatic.  Musculoskeletal: Normal range of motion.        General: Tenderness present.     Comments: Normal range of motion at the left shoulder but with pain, tenderness within the left shoulder area but no pain below the left shoulder, normal range of motion at the wrist and elbow on the left side  Skin:    Capillary Refill: Capillary refill takes less than 2 seconds.  Neurological:     General: No focal deficit present.     Mental Status: She is alert and oriented to person, place, and time.     Cranial Nerves: No cranial nerve deficit.     Sensory: No sensory deficit.     Motor: No weakness.     Comments: 5+ out of 5 strength and normal sensation throughout the left upper extremity      ED Treatments /  Results  Labs (all labs ordered are listed, but only abnormal results are displayed) Labs Reviewed - No data to display  EKG None  Radiology Dg Shoulder Left  Result Date: 02/22/2019 CLINICAL DATA:  Left shoulder pain after fall. EXAM: LEFT SHOULDER - 2+ VIEW COMPARISON:  None. FINDINGS: There is no evidence of fracture or dislocation. Mild degenerative changes of the glenohumeral and acromioclavicular joints. Soft tissues are unremarkable. IMPRESSION: Negative. Electronically Signed   By: Davina Poke M.D.   On: 02/22/2019 10:24    Procedures Procedures (including critical care time)  Medications Ordered in ED Medications  acetaminophen (TYLENOL) tablet 650 mg (650 mg Oral Given 02/22/19 1003)     Initial Impression / Assessment and Plan / ED Course  I have reviewed the triage vital signs and the nursing notes.  Pertinent labs & imaging results that were available during my care of the patient were reviewed by me and considered in my medical decision making (see chart for details).     Audrey Bennett is a 63 year old female history of hypertension who presents the ED with left shoulder pain after fall.  Patient with unremarkable vitals.  No fever.  Patient while at work had a fall in which she landed directly on her left shoulder.  Has some tenderness around the left shoulder joint.  However x-ray showed no acute fracture or malalignment.  Does not have any tenderness below the left shoulder joint.  Normal range of motion at the left elbow, left wrist.  Has good range of motion at the shoulder but with pain.  May be possibly a mild sprain but likely a contusion.  Recommend Tylenol, Motrin, sling for comfort.  Written light duty for work and recommend follow-up with primary care doctor if pain is persisting.  Discharged in good condition.  Patient did not hit her head or lose consciousness.  This chart was dictated using voice recognition software.  Despite best efforts to  proofread,  errors can occur which can change the documentation meaning.   Final Clinical Impressions(s) / ED Diagnoses   Final diagnoses:  Acute pain of left shoulder    ED Discharge Orders    None       Lennice Sites, DO 02/22/19 1141

## 2019-12-02 ENCOUNTER — Emergency Department (HOSPITAL_BASED_OUTPATIENT_CLINIC_OR_DEPARTMENT_OTHER): Payer: BC Managed Care – PPO

## 2019-12-02 ENCOUNTER — Inpatient Hospital Stay (HOSPITAL_BASED_OUTPATIENT_CLINIC_OR_DEPARTMENT_OTHER)
Admission: EM | Admit: 2019-12-02 | Discharge: 2019-12-06 | DRG: 417 | Disposition: A | Discharge status: Home / Self Care | Payer: BC Managed Care – PPO | Attending: General Surgery | Admitting: General Surgery

## 2019-12-02 ENCOUNTER — Other Ambulatory Visit: Payer: Self-pay

## 2019-12-02 ENCOUNTER — Encounter (HOSPITAL_BASED_OUTPATIENT_CLINIC_OR_DEPARTMENT_OTHER): Payer: Self-pay

## 2019-12-02 DIAGNOSIS — Z20822 Contact with and (suspected) exposure to covid-19: Secondary | ICD-10-CM | POA: Diagnosis present

## 2019-12-02 DIAGNOSIS — Z79899 Other long term (current) drug therapy: Secondary | ICD-10-CM

## 2019-12-02 DIAGNOSIS — K819 Cholecystitis, unspecified: Secondary | ICD-10-CM

## 2019-12-02 DIAGNOSIS — K8000 Calculus of gallbladder with acute cholecystitis without obstruction: Secondary | ICD-10-CM | POA: Diagnosis not present

## 2019-12-02 DIAGNOSIS — K851 Biliary acute pancreatitis without necrosis or infection: Secondary | ICD-10-CM | POA: Diagnosis present

## 2019-12-02 DIAGNOSIS — I10 Essential (primary) hypertension: Secondary | ICD-10-CM | POA: Diagnosis present

## 2019-12-02 DIAGNOSIS — Z419 Encounter for procedure for purposes other than remedying health state, unspecified: Secondary | ICD-10-CM

## 2019-12-02 DIAGNOSIS — Z888 Allergy status to other drugs, medicaments and biological substances status: Secondary | ICD-10-CM

## 2019-12-02 DIAGNOSIS — K8050 Calculus of bile duct without cholangitis or cholecystitis without obstruction: Secondary | ICD-10-CM

## 2019-12-02 DIAGNOSIS — R55 Syncope and collapse: Secondary | ICD-10-CM | POA: Diagnosis present

## 2019-12-02 LAB — CBC WITH DIFFERENTIAL/PLATELET
Abs Immature Granulocytes: 0.03 10*3/uL (ref 0.00–0.07)
Basophils Absolute: 0 10*3/uL (ref 0.0–0.1)
Basophils Relative: 0 %
Eosinophils Absolute: 0.2 10*3/uL (ref 0.0–0.5)
Eosinophils Relative: 2 %
HCT: 44.3 % (ref 36.0–46.0)
Hemoglobin: 13.9 g/dL (ref 12.0–15.0)
Immature Granulocytes: 0 %
Lymphocytes Relative: 23 %
Lymphs Abs: 1.9 10*3/uL (ref 0.7–4.0)
MCH: 26.6 pg (ref 26.0–34.0)
MCHC: 31.4 g/dL (ref 30.0–36.0)
MCV: 84.7 fL (ref 80.0–100.0)
Monocytes Absolute: 0.8 10*3/uL (ref 0.1–1.0)
Monocytes Relative: 9 %
Neutro Abs: 5.4 10*3/uL (ref 1.7–7.7)
Neutrophils Relative %: 66 %
Platelets: 334 10*3/uL (ref 150–400)
RBC: 5.23 MIL/uL — ABNORMAL HIGH (ref 3.87–5.11)
RDW: 14.6 % (ref 11.5–15.5)
WBC: 8.3 10*3/uL (ref 4.0–10.5)
nRBC: 0 % (ref 0.0–0.2)

## 2019-12-02 LAB — COMPREHENSIVE METABOLIC PANEL
ALT: 34 U/L (ref 0–44)
AST: 76 U/L — ABNORMAL HIGH (ref 15–41)
Albumin: 3.5 g/dL (ref 3.5–5.0)
Alkaline Phosphatase: 109 U/L (ref 38–126)
Anion gap: 13 (ref 5–15)
BUN: 13 mg/dL (ref 8–23)
CO2: 23 mmol/L (ref 22–32)
Calcium: 8.6 mg/dL — ABNORMAL LOW (ref 8.9–10.3)
Chloride: 101 mmol/L (ref 98–111)
Creatinine, Ser: 0.7 mg/dL (ref 0.44–1.00)
GFR calc Af Amer: >60 mL/min (ref >60)
GFR calc non Af Amer: >60 mL/min (ref >60)
Glucose, Bld: 177 mg/dL — ABNORMAL HIGH (ref 70–99)
Potassium: 3.8 mmol/L (ref 3.5–5.1)
Sodium: 137 mmol/L (ref 135–145)
Total Bilirubin: 1.2 mg/dL (ref 0.3–1.2)
Total Protein: 8.6 g/dL — ABNORMAL HIGH (ref 6.5–8.1)

## 2019-12-02 LAB — TROPONIN I (HIGH SENSITIVITY): Troponin I (High Sensitivity): 4 ng/L (ref <18)

## 2019-12-02 LAB — LIPASE, BLOOD: Lipase: 166 U/L — ABNORMAL HIGH (ref 11–51)

## 2019-12-02 IMAGING — DX DG CHEST 1V PORT
1 series · 1 of 1 positions shown · non-contrast
Comparison: Radiograph [DATE]

CLINICAL DATA: Shortness of breath, nausea, vomiting and abdominal
pain

EXAM:
PORTABLE CHEST 1 VIEW

[chest ap]
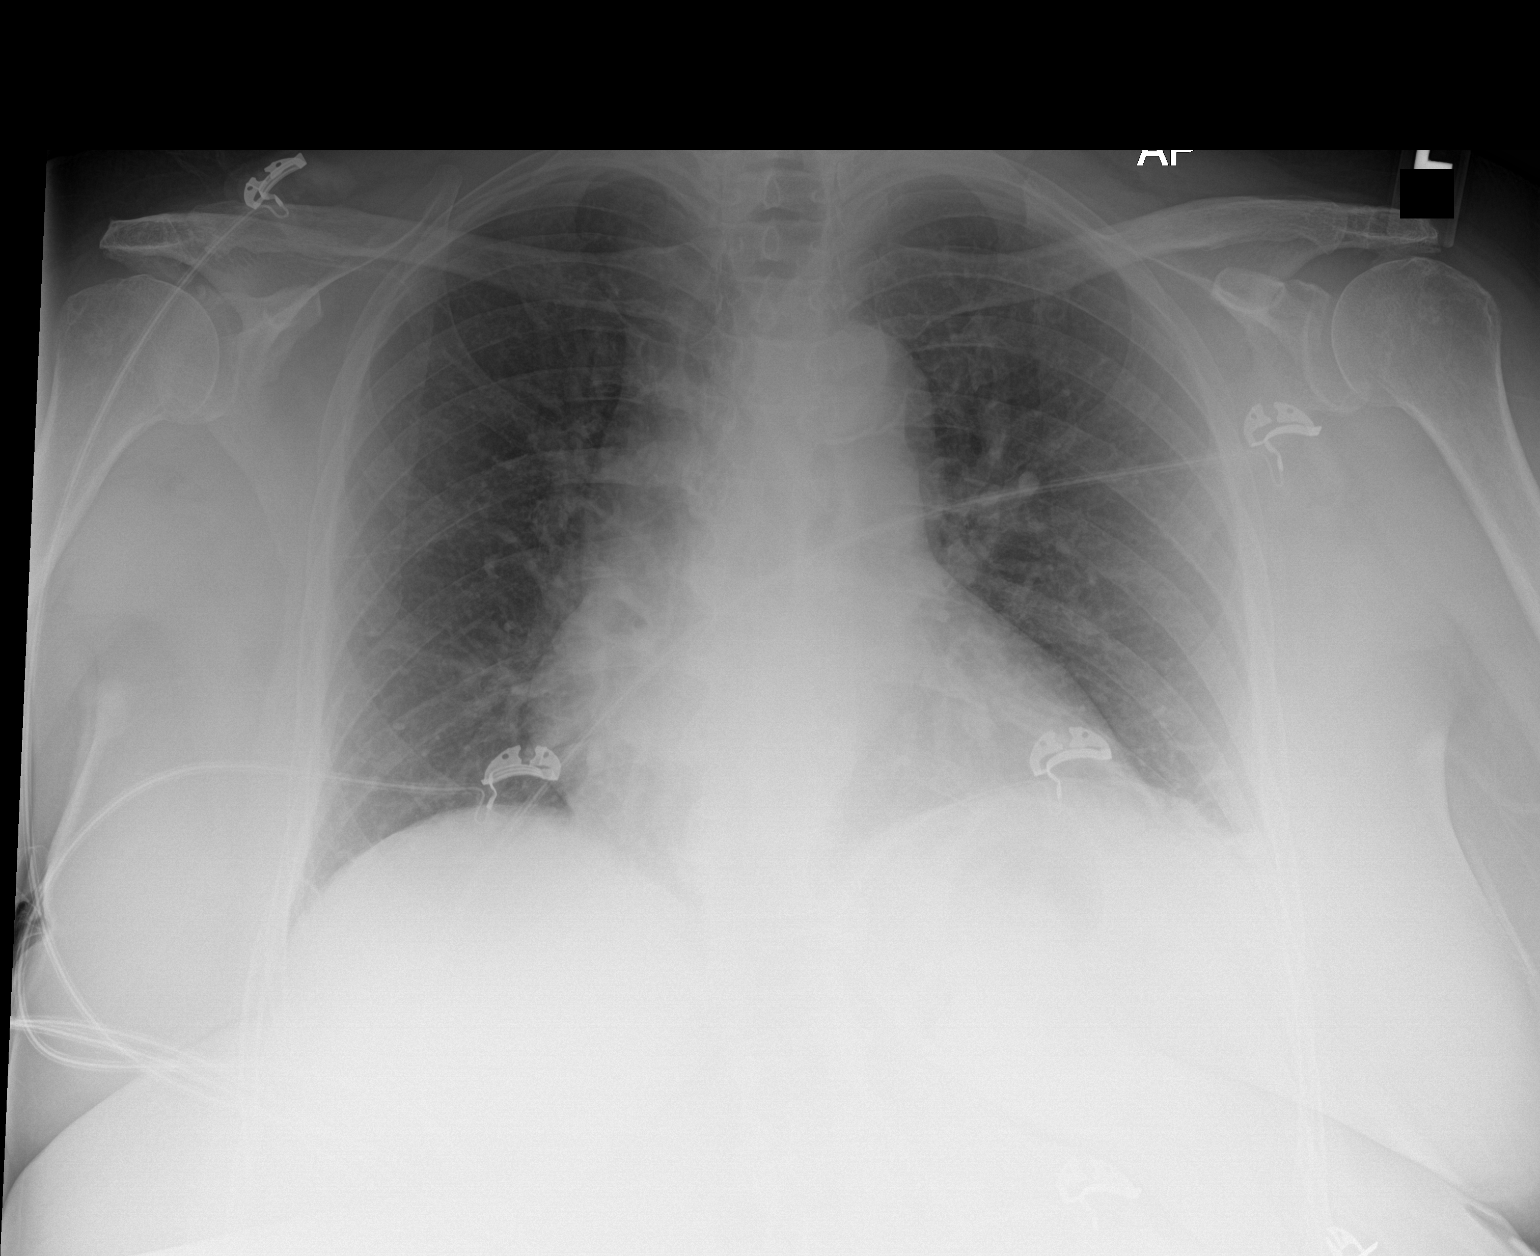

[1 of 1 positions shown; findings below may reference images not displayed]

FINDINGS: Low lung volumes with basilar atelectasis. No consolidation,
features of edema, pneumothorax, or effusion. Cardiac size within
normal limits for portable technique. The aorta is calcified. The
remaining cardiomediastinal contours are unremarkable. No acute
osseous or soft tissue abnormality. Degenerative changes are present
in the imaged spine and shoulders. Telemetry leads overlie the
chest.
IMPRESSION: Low volumes and atelectasis. No other acute cardiopulmonary
abnormality.

## 2019-12-02 IMAGING — CT CT ABD-PELV W/ CM
3 of 6 series · 14 of 46 positions shown, 16 images · IV contrast (omnipaque)
Comparison: None.

CLINICAL DATA: Mid abdominal pain with vomiting

EXAM:
CT ABDOMEN AND PELVIS WITH CONTRAST
TECHNIQUE: Multidetector CT imaging of the abdomen and pelvis was performed
using the standard protocol following bolus administration of
intravenous contrast.
CONTRAST:  100mL OMNIPAQUE IOHEXOL 300 MG/ML  SOLN

[Series 2: axial st · axial · 0.78mm/px · z∈[-358,-42]mm · 10 of 79 slices shown, 12 images (1 of 2)]
[im 8/79  soft-tissue]
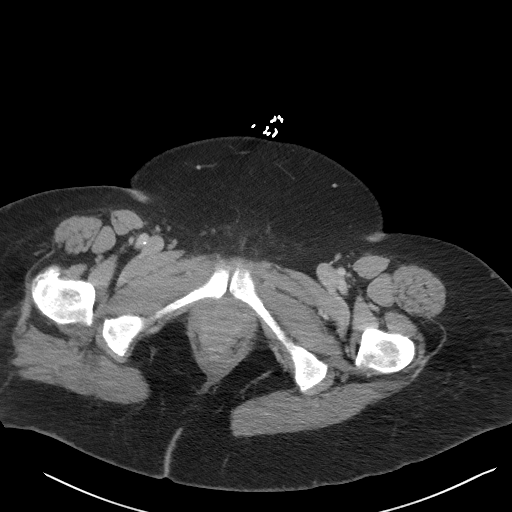
[im 8/79  bone]
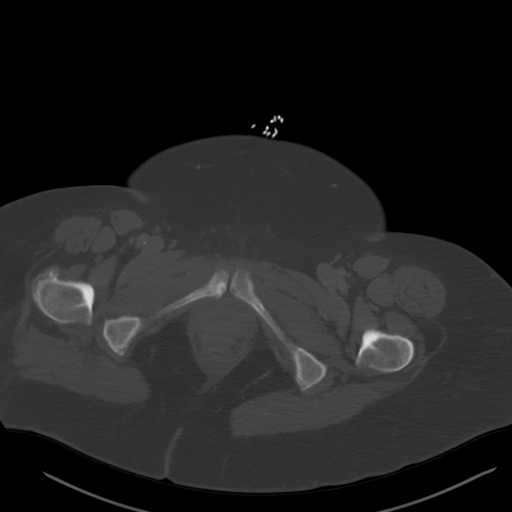
[im 15/79  soft-tissue]
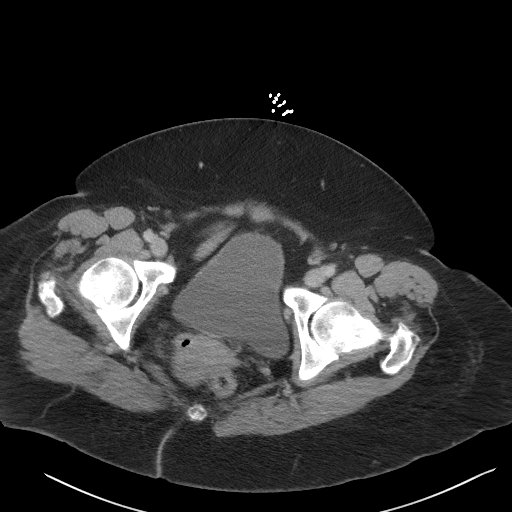
[im 22/79  soft-tissue]
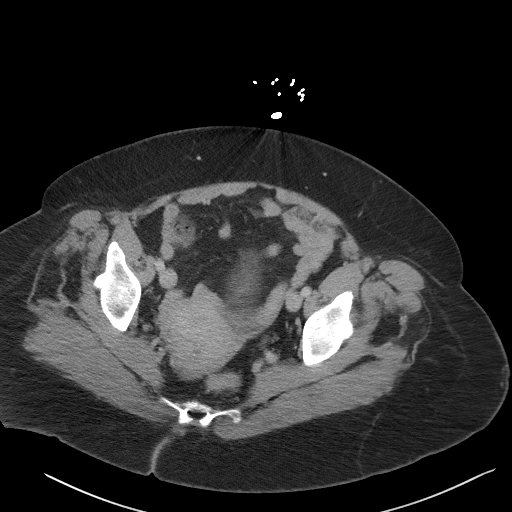
[im 29/79  soft-tissue]
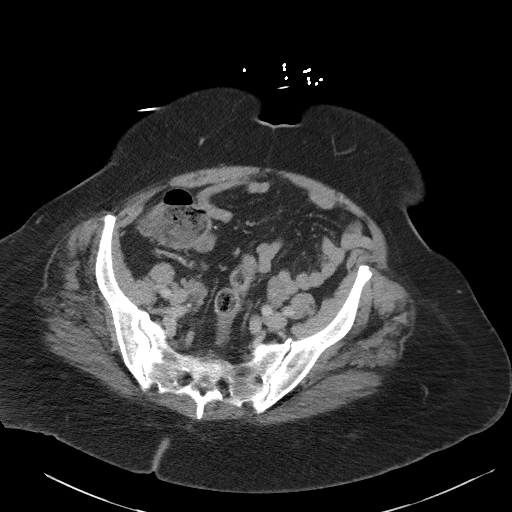
[im 36/79  soft-tissue]
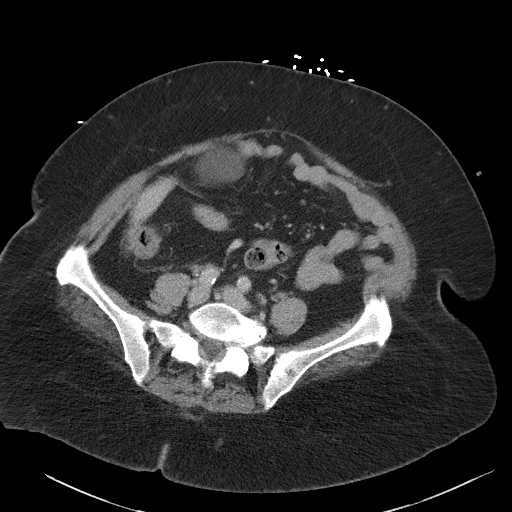
[im 43/79  soft-tissue]
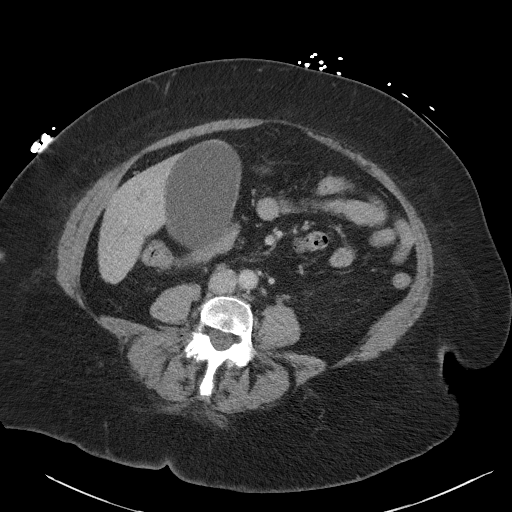
[im 50/79  soft-tissue]
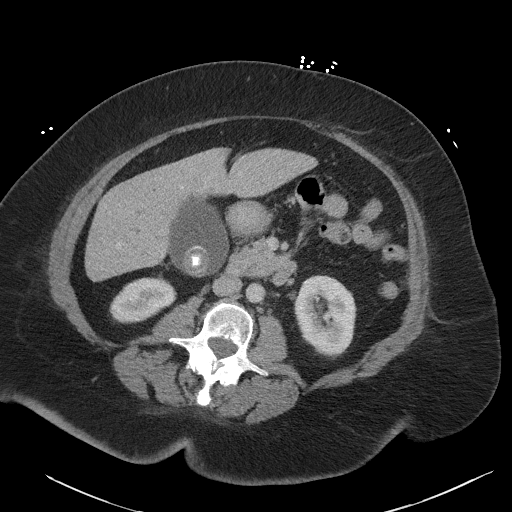
[im 57/79  soft-tissue]
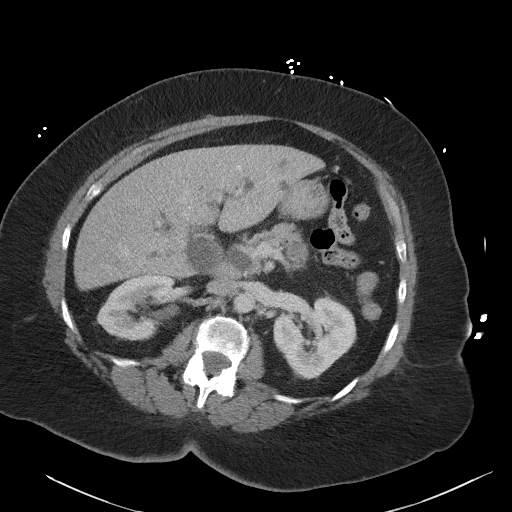
[im 64/79  soft-tissue]
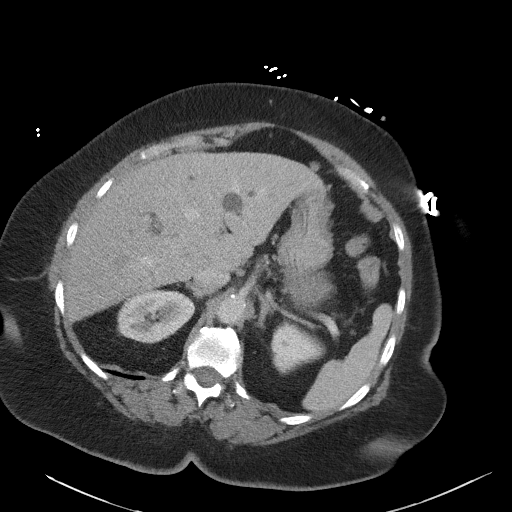
[im 64/79  bone]
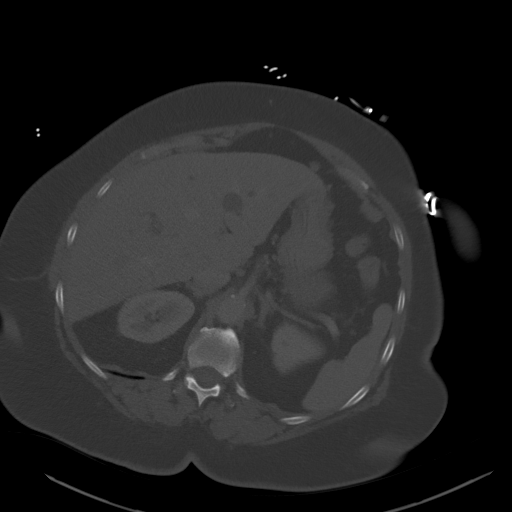
[im 71/79  soft-tissue]
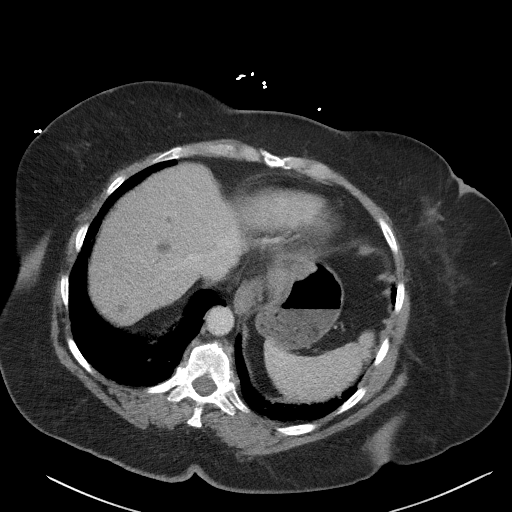

[Series 5: coronal st · coronal · 0.70mm/px · 3 of 101 slices shown]
[im 34/101  soft-tissue]
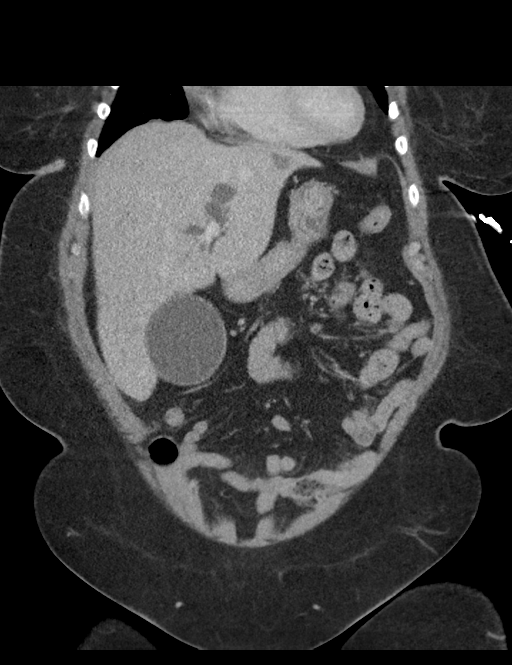
[im 45/101  soft-tissue]
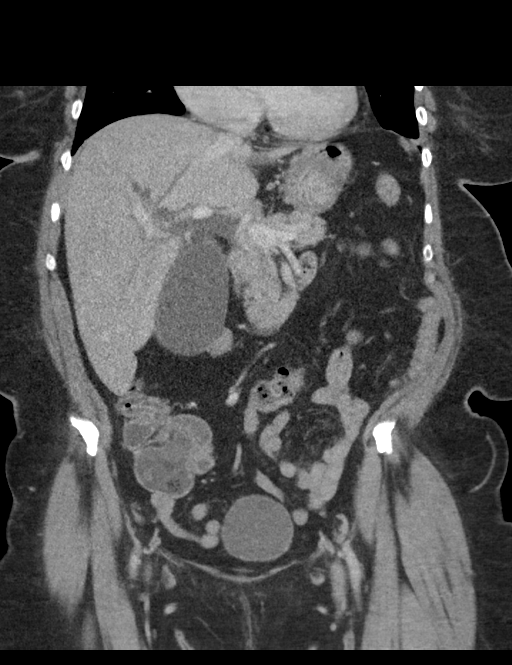
[im 56/101  soft-tissue]
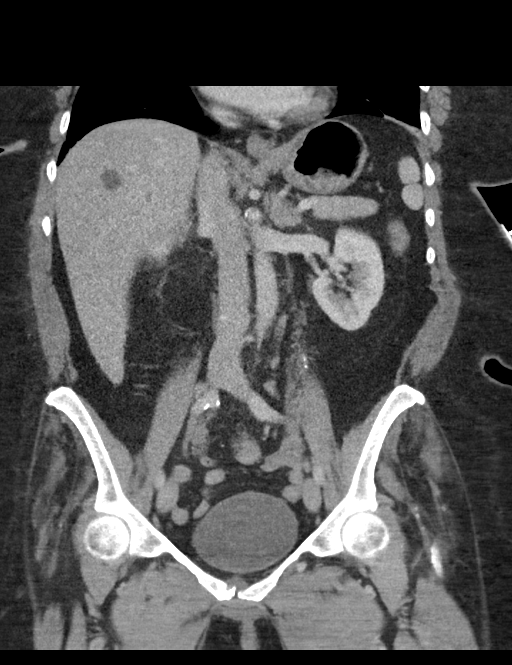

[Series 7: axial st · axial · 0.59mm/px · 1 of 80 slices shown (2 of 2)]
[im 8/80  soft-tissue]
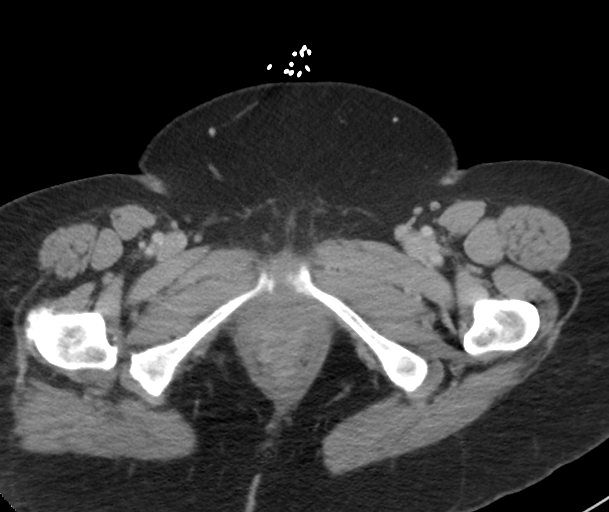

[14 of 46 positions shown; findings below may reference images not displayed]

FINDINGS: Lower chest: Atelectatic changes present in the otherwise clear lung
bases. Cardiac size at the upper limits of normal. No pericardial
effusion.

Hepatobiliary: Multiple fluid attenuation cysts present throughout
the liver. Normal hepatic attenuation. Smooth liver surface contour.
Portal and hepatic veins appear patent. There is moderate intra and
extrahepatic biliary ductal dilatation. There is distal tapering at
the level of the ampulla of Vater. The gallbladder appears distended
with some mild hazy mural thickening up to 4 mm and some trace
pericholecystic fluid in the gallbladder fossa (5/39). Gallbladder
contains at least 2 large partially calcified gallstones measuring
up to 2.4 cm in maximal diameter.

Pancreas: Tapering of the distal common bile duct at the level of
the pancreatic head. No pancreatic ductal dilatation. No focal
inflammation or discernible pancreatic lesions.

Spleen: Normal in size without focal abnormality.

Adrenals/Urinary Tract: Normal adrenal glands. Kidneys enhance and
excrete symmetrically. No concerning renal mass. No obstructive
urolithiasis or hydronephrosis. Punctate nonobstructing calculus
seen in the lower pole right kidney. Urinary bladder is
unremarkable.

Stomach/Bowel: Irregular contour of the greater curvature may
reflect an air and fluid containing gastric diverticulum with a more
normal appearance of the distal stomach. Duodenum takes a normal
sweep across the midline abdomen. No small bowel thickening or
dilatation. Elongated, air-filled appendix coursing in a retrocecal
position terminating anterior to the sacrum. No adjacent
inflammation. No colonic dilatation or wall thickening.

Vascular/Lymphatic: Atherosclerotic calcifications within the
abdominal aorta and branch vessels. No aneurysm or ectasia. No
enlarged abdominopelvic lymph nodes.

Reproductive: Anteverted uterus.  No concerning adnexal lesions.

Other: No abdominopelvic free fluid or free gas. No bowel containing
hernias.

Musculoskeletal: No acute osseous abnormality or suspicious osseous
lesion. Multilevel degenerative changes are present in the imaged
portions of the spine. Additional degenerative changes in the hips
and pelvis with sclerotic features of the SI joints which could
reflect sequela of osteitis condensans ilii.
IMPRESSION: 1. The gallbladder appears distended with some mild hazy mural
thickening up to 4 mm and some trace pericholecystic fluid in the
gallbladder fossa. Findings are concerning for acute cholecystitis.
2. Additional moderate intra and extrahepatic biliary ductal
dilatation. There is distal tapering at the level of the Ampulla of
Vater. Could be better assessed with right upper quadrant
ultrasound.
3. Punctate nonobstructing right nephrolithiasis.
4. Irregular contour of the proximal greater curvature of the
stomach may reflect an noninflamed gastric diverticulum.
5. Aortic Atherosclerosis ([GL]-[GL]).

## 2019-12-02 MED ORDER — ONDANSETRON HCL 4 MG/2ML IJ SOLN
4.0000 mg | Freq: Once | INTRAMUSCULAR | Status: AC
  Administered 2019-12-02: 4 mg via INTRAVENOUS
  Filled 2019-12-02: qty 2

## 2019-12-02 MED ORDER — IOHEXOL 300 MG/ML  SOLN
100.0000 mL | Freq: Once | INTRAMUSCULAR | Status: AC | PRN
  Administered 2019-12-02: 100 mL via INTRAVENOUS

## 2019-12-02 MED ORDER — SODIUM CHLORIDE 0.9 % IV BOLUS
500.0000 mL | Freq: Once | INTRAVENOUS | Status: AC
  Administered 2019-12-02: 500 mL via INTRAVENOUS

## 2019-12-02 MED ORDER — HYDROMORPHONE HCL 1 MG/ML IJ SOLN
0.5000 mg | INTRAMUSCULAR | Status: AC | PRN
  Administered 2019-12-02 – 2019-12-03 (×2): 0.5 mg via INTRAVENOUS
  Filled 2019-12-02 (×2): qty 1

## 2019-12-02 NOTE — ED Provider Notes (Signed)
[facility] EMERGENCY DEPARTMENT Provider Note   CSN: [id number] Arrival date & time: 12/02/19  2120     History Chief Complaint  Patient presents with  . Emesis    Audrey Bennett is a 64 y.o. female.  Patient with history of hypertension, no previous abdominal surgeries --presents the emergency department with abdominal pain, vomiting.  History obtained from the patient's son as well.  Approximately 6 days ago patient began having generalized stomach discomfort with associated vomiting and diarrhea.  She thought this might have been related to some food that she ate.  She was not vomiting every day but felt nauseous.  Symptoms gradually improved over the past several days, however acutely worsened tonight after eating steak.  Over the past 2 to 3 hours patient has had severe abdominal pain with vomiting.  She reports pain in the mid abdomen.  She is breathing very rapidly due to to the pain and had a brief syncopal episode after arrival to the emergency department.  She currently denies chest pain or shortness of breath.        Past Medical History:  Diagnosis Date  . Hypertension   . SVT (supraventricular tachycardia) (HCC)     There are no problems to display for this patient.   Past Surgical History:  Procedure Laterality Date  . BREAST SURGERY       OB History   No obstetric history on file.     No family history on file.  Social History   Tobacco Use  . Smoking status: Never Smoker  . Smokeless tobacco: Never Used  Substance Use Topics  . Alcohol use: No  . Drug use: No    Home Medications Prior to Admission medications   Medication Sig Start Date End Date Taking? Authorizing Provider  diltiazem (DILACOR XR) 240 MG 24 hr capsule Take 240 mg by mouth daily.    [provider]  hydrochlorothiazide (MICROZIDE) 12.5 MG capsule Take 12.5 mg by mouth daily.    [provider]  oseltamivir (TAMIFLU) 75 MG capsule Take 1 capsule (75  mg total) by mouth every 12 (twelve) hours. 05/28/17   Dartha Lodge, PA-C    Allergies    Ace inhibitors  Review of Systems   Review of Systems  Constitutional: Negative for fever.  HENT: Negative for rhinorrhea and sore throat.   Eyes: Negative for redness.  Respiratory: Negative for cough.   Cardiovascular: Negative for chest pain.  Gastrointestinal: Positive for abdominal pain, diarrhea, nausea and vomiting.  Genitourinary: Negative for dysuria.  Musculoskeletal: Negative for myalgias.  Skin: Negative for rash.  Neurological: Positive for light-headedness. Negative for headaches.    Physical Exam Updated Vital Signs BP (!) 153/78 (BP Location: Right Arm)   Pulse 95   Temp 98.4 F (36.9 C) (Oral)   Resp (!) 36   Ht 5' (1.524 m)   Wt 77.1 kg   SpO2 96%   BMI 33.20 kg/m   Physical Exam Vitals and nursing note reviewed.  Constitutional:      General: She is in acute distress.     Appearance: She is well-developed.  HENT:     Head: Normocephalic and atraumatic.  Eyes:     General:        Right eye: No discharge.        Left eye: No discharge.     Conjunctiva/sclera: Conjunctivae normal.  Cardiovascular:     Rate and Rhythm: Normal rate and regular rhythm.  Heart sounds: Normal heart sounds.  Pulmonary:     Effort: Pulmonary effort is normal. Tachypnea present.     Breath sounds: Normal breath sounds.     Comments: Patient hyperventilating on arrival to the exam room.  After administration of pain medication, normal breathing, clear lungs. Abdominal:     Palpations: Abdomen is soft.     Tenderness: There is abdominal tenderness in the right upper quadrant and epigastric area. There is guarding. There is no rebound.     Comments: Moderate right lateral, right upper abd, and epigastric pain.   Musculoskeletal:     Cervical back: Normal range of motion and neck supple.  Skin:    General: Skin is warm and dry.  Neurological:     Mental Status: She is alert.       ED Results / Procedures / Treatments   Labs (all labs ordered are listed, but only abnormal results are displayed) Labs Reviewed  CBC WITH DIFFERENTIAL/PLATELET - Abnormal; Notable for the following components:      Result Value   RBC 5.23 (*)    All other components within normal limits  COMPREHENSIVE METABOLIC PANEL - Abnormal; Notable for the following components:   Glucose, Bld 177 (*)    Calcium 8.6 (*)    Total Protein 8.6 (*)    AST 76 (*)    All other components within normal limits  LIPASE, BLOOD - Abnormal; Notable for the following components:   Lipase 166 (*)    All other components within normal limits  SARS CORONAVIRUS 2 BY RT PCR (HOSPITAL ORDER[facility])  URINALYSIS, ROUTINE W REFLEX MICROSCOPIC  TROPONIN I (HIGH SENSITIVITY)    EKG EKG Interpretation  Date/Time:  Sunday December 02 2019 21:31:27 EDT Ventricular Rate:  96 PR Interval:    QRS Duration: 93 QT Interval:  371 QTC Calculation: 469 R Axis:   -2 Text Interpretation: Sinus rhythm Abnormal R-wave progression, early transition Left ventricular hypertrophy No old tracing to compare Confirmed by Meridee ScoreButler, Michael [phone]([id number]) on 12/02/2019 9:45:29 PM   Radiology CT ABDOMEN PELVIS W CONTRAST  Result Date: 12/02/2019 CLINICAL DATA:  Mid abdominal pain with vomiting EXAM: CT ABDOMEN AND PELVIS WITH CONTRAST TECHNIQUE: Multidetector CT imaging of the abdomen and pelvis was performed using the standard protocol following bolus administration of intravenous contrast. CONTRAST:  100mL OMNIPAQUE IOHEXOL 300 MG/ML  SOLN COMPARISON:  None. FINDINGS: Lower chest: Atelectatic changes present in the otherwise clear lung bases. Cardiac size at the upper limits of normal. No pericardial effusion. Hepatobiliary: Multiple fluid attenuation cysts present throughout the liver. Normal hepatic attenuation. Smooth liver surface contour. Portal and hepatic veins appear patent. There is moderate intra and  extrahepatic biliary ductal dilatation. There is distal tapering at the level of the ampulla of Vater. The gallbladder appears distended with some mild hazy mural thickening up to 4 mm and some trace pericholecystic fluid in the gallbladder fossa (5/39). Gallbladder contains at least 2 large partially calcified gallstones measuring up to 2.4 cm in maximal diameter. Pancreas: Tapering of the distal common bile duct at the level of the pancreatic head. No pancreatic ductal dilatation. No focal inflammation or discernible pancreatic lesions. Spleen: Normal in size without focal abnormality. Adrenals/Urinary Tract: Normal adrenal glands. Kidneys enhance and excrete symmetrically. No concerning renal mass. No obstructive urolithiasis or hydronephrosis. Punctate nonobstructing calculus seen in the lower pole right kidney. Urinary bladder is unremarkable. Stomach/Bowel: Irregular contour of the greater curvature may reflect an air  and fluid containing gastric diverticulum with a more normal appearance of the distal stomach. Duodenum takes a normal sweep across the midline abdomen. No small bowel thickening or dilatation. Elongated, air-filled appendix coursing in a retrocecal position terminating anterior to the sacrum. No adjacent inflammation. No colonic dilatation or wall thickening. Vascular/Lymphatic: Atherosclerotic calcifications within the abdominal aorta and branch vessels. No aneurysm or ectasia. No enlarged abdominopelvic lymph nodes. Reproductive: Anteverted uterus.  No concerning adnexal lesions. Other: No abdominopelvic free fluid or free gas. No bowel containing hernias. Musculoskeletal: No acute osseous abnormality or suspicious osseous lesion. Multilevel degenerative changes are present in the imaged portions of the spine. Additional degenerative changes in the hips and pelvis with sclerotic features of the SI joints which could reflect sequela of osteitis condensans ilii. IMPRESSION: 1. The gallbladder  appears distended with some mild hazy mural thickening up to 4 mm and some trace pericholecystic fluid in the gallbladder fossa. Findings are concerning for acute cholecystitis. 2. Additional moderate intra and extrahepatic biliary ductal dilatation. There is distal tapering at the level of the Ampulla of Vater. Could be better assessed with right upper quadrant ultrasound. 3. Punctate nonobstructing right nephrolithiasis. 4. Irregular contour of the proximal greater curvature of the stomach may reflect an noninflamed gastric diverticulum. 5. Aortic Atherosclerosis (ICD10-I70.0). Electronically Signed   By: Kreg Shropshire M.D.   On: 12/02/2019 23:11   DG Chest Port 1 View  Result Date: 12/02/2019 CLINICAL DATA:  Shortness of breath, nausea, vomiting and abdominal pain EXAM: PORTABLE CHEST 1 VIEW COMPARISON:  Radiograph 05/28/2017 FINDINGS: Low lung volumes with basilar atelectasis. No consolidation, features of edema, pneumothorax, or effusion. Cardiac size within normal limits for portable technique. The aorta is calcified. The remaining cardiomediastinal contours are unremarkable. No acute osseous or soft tissue abnormality. Degenerative changes are present in the imaged spine and shoulders. Telemetry leads overlie the chest. IMPRESSION: Low volumes and atelectasis. No other acute cardiopulmonary abnormality. Electronically Signed   By: Kreg Shropshire M.D.   On: 12/02/2019 22:19    Procedures Procedures (including critical care time)  Medications Ordered in ED Medications  HYDROmorphone (DILAUDID) injection 0.5 mg (0.5 mg Intravenous Given 12/02/19 2152)  ondansetron (ZOFRAN) injection 4 mg (4 mg Intravenous Given 12/02/19 2150)  sodium chloride 0.9 % bolus 500 mL ( Intravenous Stopped 12/02/19 2225)  iohexol (OMNIPAQUE) 300 MG/ML solution 100 mL (100 mLs Intravenous Contrast Given 12/02/19 2238)    ED Course  I have reviewed the triage vital signs and the nursing notes.  Pertinent labs & imaging results  that were available during my care of the patient were reviewed by me and considered in my medical decision making (see chart for details).  Patient seen and examined.  EKG reviewed.  Work-up ordered.  CT imaging of the abdomen and pelvis ordered.  Chest x-ray ordered.  Vital signs reviewed and are as follows: BP (!) 144/71   Pulse 88   Temp 98.4 F (36.9 C) (Oral)   Resp 14   Ht 5' (1.524 m)   Wt 77.1 kg   SpO2 99%   BMI 33.20 kg/m   Labs demonstrate mildly elevated lipase.  Normal white blood cell count.  Chest x-ray is clear without signs of pneumonia or free air.  Awaiting CT imaging.  Patient much more comfortable after administration of pain medication.  CT reviewed by myself.  Patient has been seen by Dr. Charm Barges.  Patient with signs of acute cholecystitis with biliary duct dilation.  Will need admission to  the hospital.  Patient and son updated.  Dr. Wilkie Aye ordered patient and will speak with general surgery and admit patient to the hospital.  Covid testing ordered.   Clinical Course as of Dec 01 2257  Wynelle Link Dec 02, 2019  [id number] 64 year old female with recent nausea vomiting diarrhea that had resolved now with acute midepigastric abdominal pain severe in nature causing her to hyperventilate and almost passed out.  It did not radiate anywhere.  Currently has 3 out of 10 pain.  No urinary symptoms.  Lab work showing a normal white count, does have an elevation of her lipase at 166.  Getting CT imaging.  Disposition per results of testing.   [MB]    Clinical Course User Index [MB] Terrilee Files, MD   MDM Rules/Calculators/A&P                           Admit for suspected cholecystitis, cannot r/o choledocholithiasis.     Final Clinical Impression(s) / ED Diagnoses Final diagnoses:  Cholecystitis    Rx / DC Orders ED Discharge Orders    None       Renne Crigler, PA-C 12/02/19 2340    Horton, Mayer Masker, MD 12/03/19 [phone]

## 2019-12-02 NOTE — ED Triage Notes (Signed)
Pt c/o N/V/D and generalized abd pain that started 6 days ago. Symptoms have worsened throughout the week. Pt noted to be hyperventilating during triage. As pt was transferring to bed pt had a syncopal episode that last approx 10 sec. Pt was assisted to bed. Pt A/ox4 after syncopal episode.

## 2019-12-03 ENCOUNTER — Other Ambulatory Visit: Payer: Self-pay

## 2019-12-03 ENCOUNTER — Inpatient Hospital Stay (HOSPITAL_COMMUNITY): Payer: BC Managed Care – PPO

## 2019-12-03 DIAGNOSIS — Z888 Allergy status to other drugs, medicaments and biological substances status: Secondary | ICD-10-CM | POA: Diagnosis not present

## 2019-12-03 DIAGNOSIS — K819 Cholecystitis, unspecified: Secondary | ICD-10-CM | POA: Diagnosis present

## 2019-12-03 DIAGNOSIS — K8000 Calculus of gallbladder with acute cholecystitis without obstruction: Secondary | ICD-10-CM | POA: Diagnosis present

## 2019-12-03 DIAGNOSIS — I10 Essential (primary) hypertension: Secondary | ICD-10-CM | POA: Diagnosis present

## 2019-12-03 DIAGNOSIS — Z20822 Contact with and (suspected) exposure to covid-19: Secondary | ICD-10-CM | POA: Diagnosis present

## 2019-12-03 DIAGNOSIS — Z79899 Other long term (current) drug therapy: Secondary | ICD-10-CM | POA: Diagnosis not present

## 2019-12-03 DIAGNOSIS — R55 Syncope and collapse: Secondary | ICD-10-CM | POA: Diagnosis present

## 2019-12-03 DIAGNOSIS — K851 Biliary acute pancreatitis without necrosis or infection: Secondary | ICD-10-CM | POA: Diagnosis present

## 2019-12-03 LAB — URINALYSIS, ROUTINE W REFLEX MICROSCOPIC
Bilirubin Urine: NEGATIVE
Glucose, UA: NEGATIVE mg/dL
Hgb urine dipstick: NEGATIVE
Ketones, ur: NEGATIVE mg/dL
Leukocytes,Ua: NEGATIVE
Nitrite: NEGATIVE
Protein, ur: NEGATIVE mg/dL
Specific Gravity, Urine: 1.01 (ref 1.005–1.030)
pH: 5.5 (ref 5.0–8.0)

## 2019-12-03 LAB — LIPASE, BLOOD: Lipase: 296 U/L — ABNORMAL HIGH (ref 11–51)

## 2019-12-03 LAB — SARS CORONAVIRUS 2 BY RT PCR (HOSPITAL ORDER, PERFORMED IN ~~LOC~~ HOSPITAL LAB): SARS Coronavirus 2: NEGATIVE

## 2019-12-03 LAB — HIV ANTIBODY (ROUTINE TESTING W REFLEX): HIV Screen 4th Generation wRfx: NONREACTIVE

## 2019-12-03 IMAGING — MR MR 3D RECON AT [facility]
13 of 19 series · 32 of 48 positions shown · IV contrast (gadavist)
Comparison: [DATE] CT

CLINICAL DATA: Biliary dilatation with CT findings suspicious for
cholecystitis, query choledocholithiasis.

EXAM:
MRI ABDOMEN WITHOUT AND WITH CONTRAST (INCLUDING MRCP)
TECHNIQUE: Multiplanar multisequence MR imaging of the abdomen was performed
both before and after the administration of intravenous contrast.
Heavily T2-weighted images of the biliary and pancreatic ducts were
obtained, and three-dimensional MRCP images were rendered by post
processing.
CONTRAST:  10mL GADAVIST GADOBUTROL 1 MMOL/ML IV SOLN

[Series 3: T2 fat-sat · axial · 6.0mm · 1.22mm/px · z∈[-127,+176]mm · 3 of 43 slices shown]
[im 1/43]
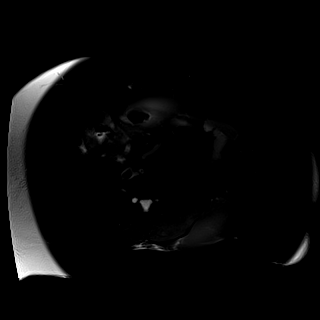
[im 22/43]
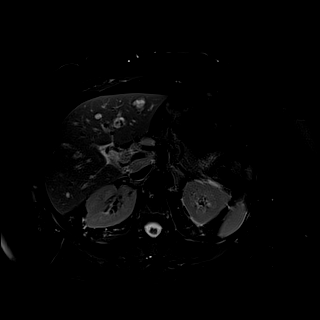
[im 43/43]
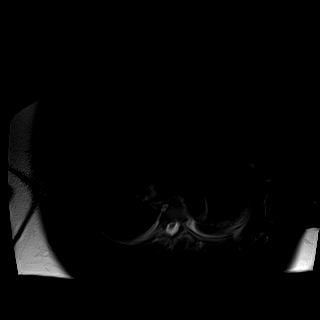

[Series 6: T2 · coronal · 6.0mm · 1.48mm/px · 1 of 34 slices shown (1 of 2)]
[im 1/34]
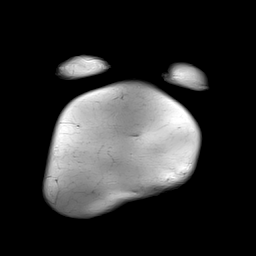

[Series 7: DWI · axial · 6.0mm · 1.49mm/px · z∈[-129,+152]mm · 3 of 80 slices shown (1 of 2)]
[im 1/80]
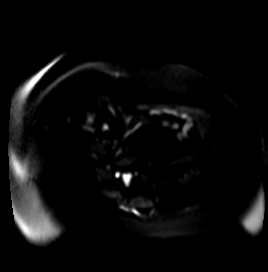
[im 40/80]
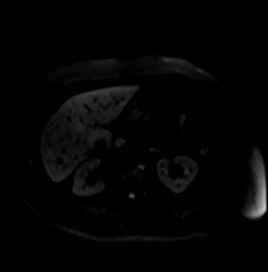
[im 80/80]
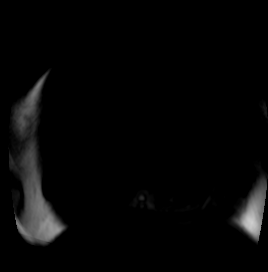

[Series 8: DWI · axial · 6.0mm · 1.49mm/px · z∈[-129,+152]mm · 2 of 40 slices shown (2 of 2)]
[im 1/40]
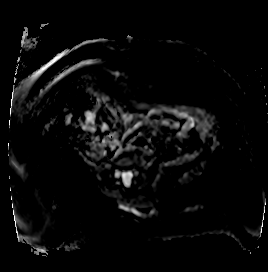
[im 40/40]
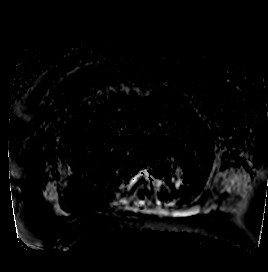

[Series 9: T1 · axial · 3.5mm · 1.19mm/px · z∈[-111,+138]mm · 3 of 72 slices shown (1 of 2)]
[im 1/72]
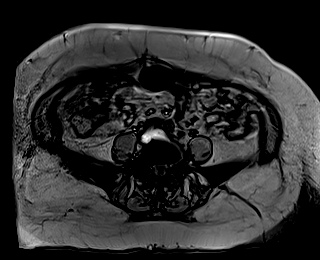
[im 36/72]
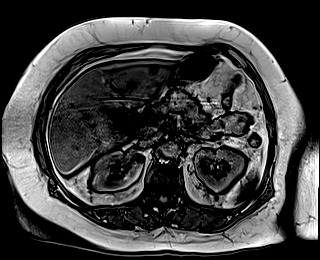
[im 72/72]
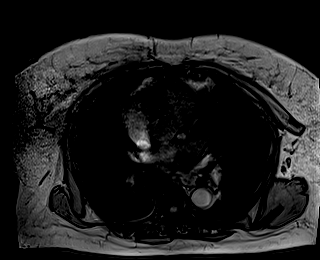

[Series 10: T1 · axial · 3.5mm · 1.19mm/px · z∈[-111,+138]mm · 3 of 72 slices shown (2 of 2)]
[im 1/72]
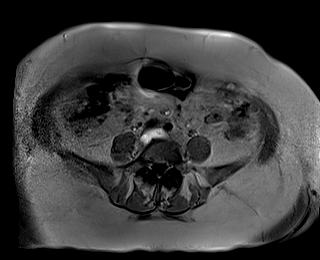
[im 36/72]
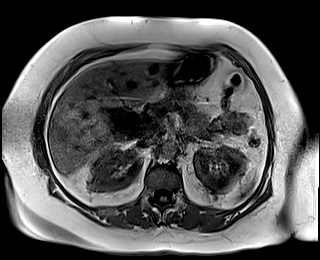
[im 72/72]
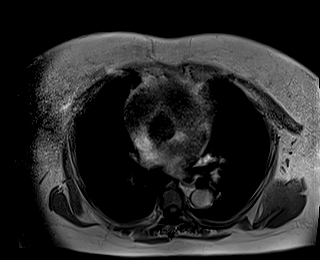

[Series 11: cor obl thk · sagittal · 50.0mm · 0.78mm/px · 1 of 9 slices shown]
[im 1/9]
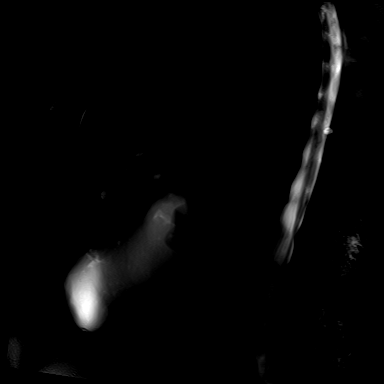

[Series 13: cor_3d_spc_trig · coronal · 1.0mm · 0.49mm/px · 3 of 80 slices shown]
[im 1/80]
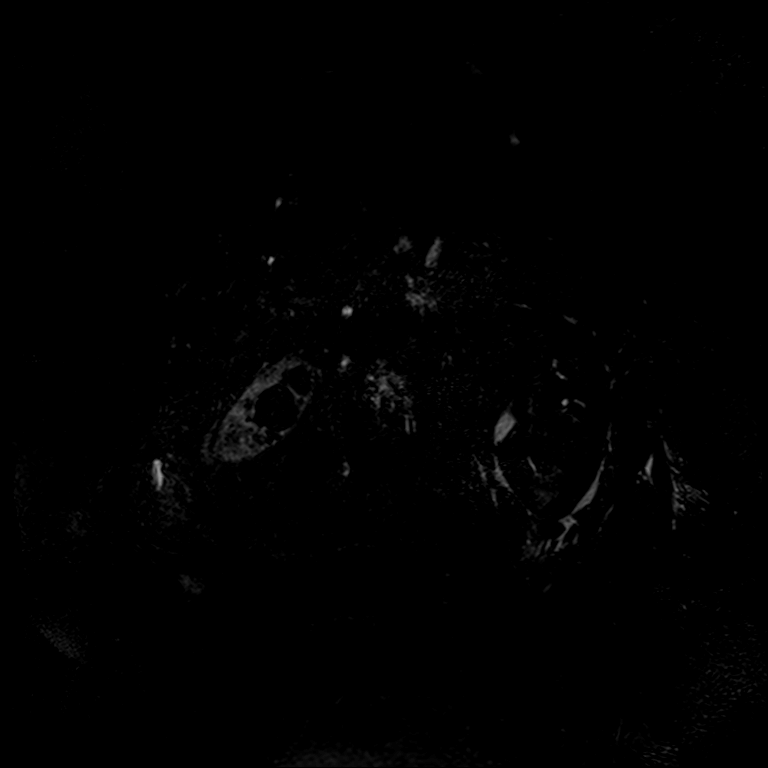
[im 40/80]
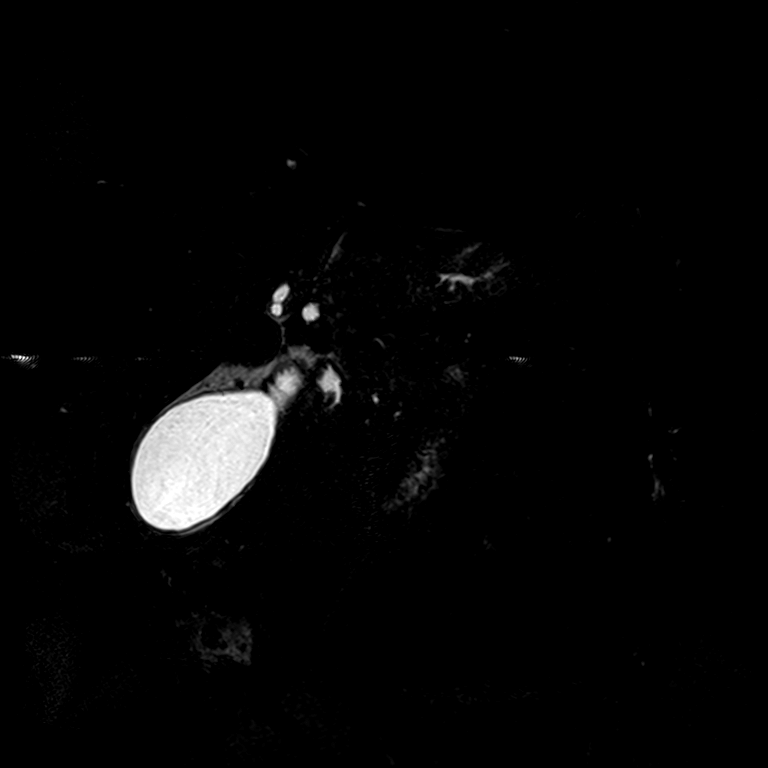
[im 80/80]
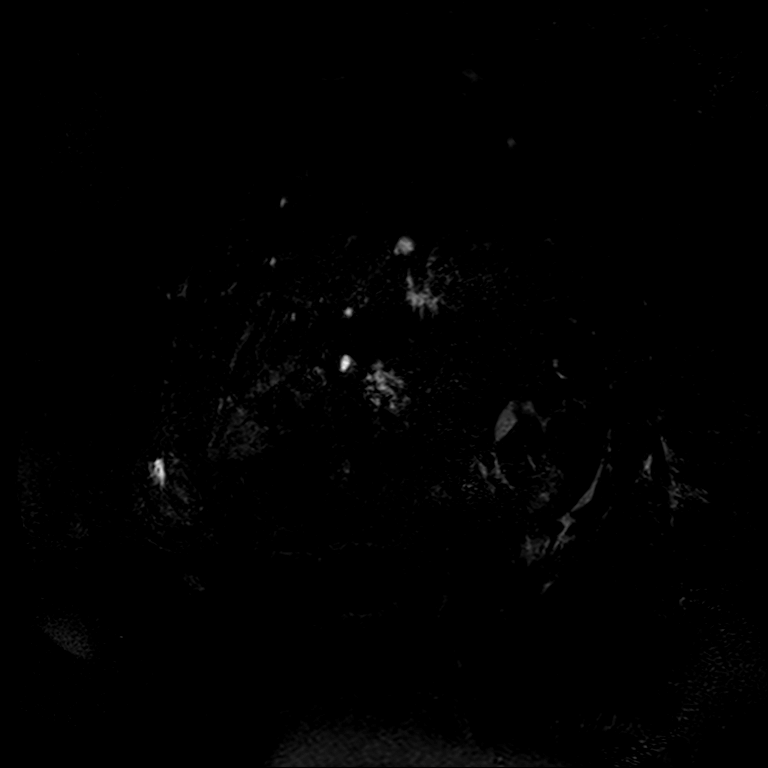

[Series 15: T2 · axial · 6.0mm · 1.48mm/px · 1 of 34 slices shown (2 of 2)]
[im 1/34]
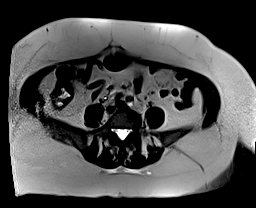

[Series 17: T1 dynamic · axial · 3.0mm · 1.19mm/px · z∈[-116,+121]mm · 3 of 80 slices shown (1 of 4)]
[im 1/80]
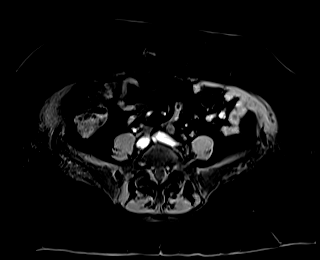
[im 40/80]
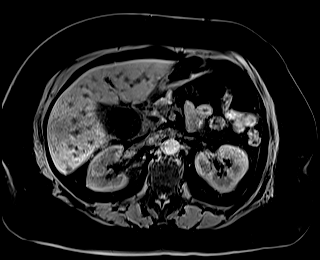
[im 80/80]
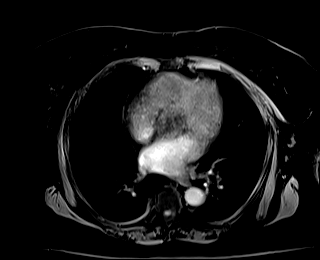

[Series 20: T1 dynamic · axial · 3.0mm · 1.19mm/px · z∈[-116,+121]mm · 3 of 80 slices shown (2 of 4)]
[im 1/80]
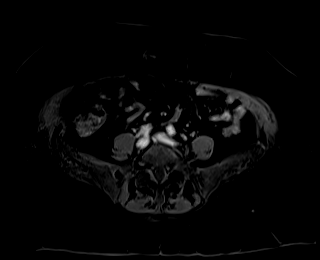
[im 40/80]
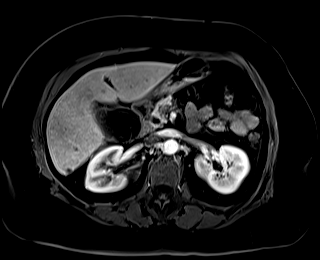
[im 80/80]
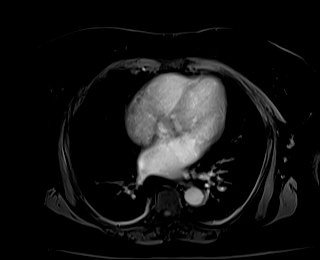

[Series 24: T1 dynamic · axial · 3.0mm · 1.19mm/px · z∈[-116,+121]mm · 3 of 80 slices shown (3 of 4)]
[im 1/80]
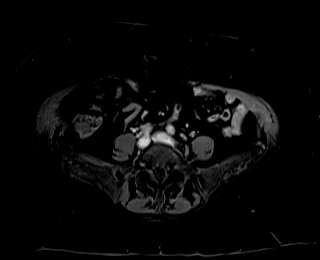
[im 40/80]
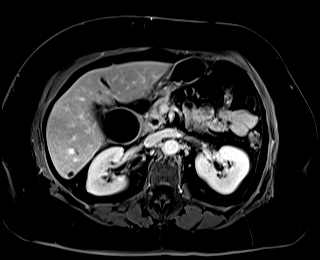
[im 80/80]
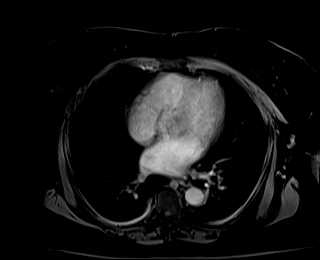

[Series 25: T1 dynamic · coronal · 4.0mm · 1.41mm/px · 3 of 64 slices shown (4 of 4)]
[im 1/64]
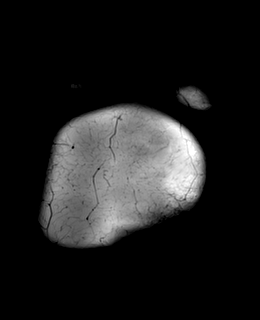
[im 32/64]
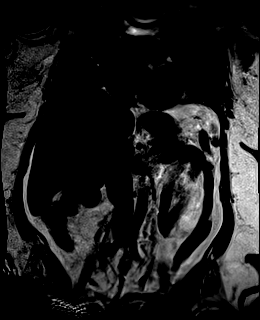
[im 64/64]
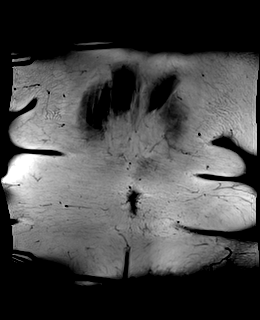

[32 of 48 positions shown; findings below may reference images not displayed]

FINDINGS: Despite efforts by the technologist and patient, motion artifact is
present on today's exam and could not be eliminated. This reduces
exam sensitivity and specificity.

Lower chest: Trace bilateral pleural effusions. Borderline
cardiomegaly.

Hepatobiliary: There are 2 larger gallstones measuring up to 2.5 cm
in diameter in addition to debris or smaller gallstone subtle
dependently in the gallbladder. Mildly abnormal gallbladder wall
thickening along with moderate distention of the gallbladder noted.

There scattered nonenhancing fluid signal hepatic lesions compatible
with cysts. Some of the hepatic lesions demonstrate mild rim
enhancement. At least one of the lesions in segment 7 of the liver
appears to have thicker enhancing margins, measuring about 1.6 by
1.2 cm on image [DATE], and is technically nonspecific. No
well-defined filling defect although there is some adenopathy in the
porta hepatis which may be extrinsically contributing to the
narrowing of the common hepatic duct. I not see a well-defined
cholangiocarcinoma. Presumably given the relative patency of the
common hepatic duct and larger caliber on recent CT, this may
represent some form of spasm or inflammation of the common hepatic
duct.

There is abnormal narrowing of the common hepatic duct. The right
bile duct is dilated but the left hepatic duct is nondilated. This
dilation extends into a region of narrowing of the biliary tree in
the vicinity of the confluence and common hepatic duct which appears
to be new compared to the CT examination of [DATE] p.m.

Pancreas:  Unremarkable

Spleen:  Unremarkable

Adrenals/Urinary Tract:  Unremarkable

Stomach/Bowel: Unremarkable

Vascular/Lymphatic: Aortoiliac atherosclerotic vascular disease.
Portacaval node 1.4 cm in short axis. Porta hepatis/peripancreatic
node 1.3 cm in short axis, image [DATE]. Left retrocrural node 0.8 cm
in short axis, image 38/20.

Other: Nonspecific upper omental nodule or lymph node measuring
cm in long axis image [DATE].

Musculoskeletal: Moderate lumbar spondylosis and degenerative disc
disease.
IMPRESSION: 1. Intrahepatic dilatation with borderline dilation of the common
bile, along duct with abnormal narrowing of the common hepatic duct,
and of the central right bile duct. This narrowing may represent
some form of spasm or inflammation of the common hepatic duct, as
the narrowing represents a change from the CT from [DATE]. I not
see a well-defined cholangiocarcinoma, although there are some
limitations of today's exam due to motion artifact and other
factors. There is some adenopathy in the porta hepatis and
retroperitoneum, which may be extrinsically contributing to the
narrowing of the common hepatic duct. No well-defined filling defect
within the lumen of the extrahepatic biliary tree.
2. Cholelithiasis with abnormal gallbladder wall thickening and
moderate distention. Acute cholecystitis is a distinct possibility.
3. Trace bilateral pleural effusions.
4. Moderate lumbar spondylosis and degenerative disc disease.
5. Scattered liver lesions, some of which are cystic. Several have
thin enhancing margins and may represent biliary hamartomas. A
lesion in segment 7 has a thicker marginal enhancement on all series
and is technically nonspecific. Biliary cystadenoma not excluded.
Micro abscesses or cystic liver metastatic disease not totally
excluded given this unusual enhancement appearance
6. Despite efforts by the technologist and patient, motion artifact
is present on today's exam and could not be eliminated. This reduces
exam sensitivity and specificity.

## 2019-12-03 IMAGING — MR MR ABDOMEN WO/W CM MRCP
16 of 22 series · 33 of 48 positions shown · IV contrast (gadavist)
Comparison: [DATE] CT

CLINICAL DATA: Biliary dilatation with CT findings suspicious for
cholecystitis, query choledocholithiasis.

EXAM:
MRI ABDOMEN WITHOUT AND WITH CONTRAST (INCLUDING MRCP)
TECHNIQUE: Multiplanar multisequence MR imaging of the abdomen was performed
both before and after the administration of intravenous contrast.
Heavily T2-weighted images of the biliary and pancreatic ducts were
obtained, and three-dimensional MRCP images were rendered by post
processing.
CONTRAST:  10mL GADAVIST GADOBUTROL 1 MMOL/ML IV SOLN

[Series 3: T2 fat-sat · axial · 6.0mm · 1.22mm/px · 1 of 43 slices shown]
[im 1/43]
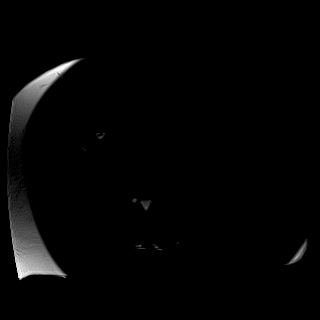

[Series 6: T2 · coronal · 6.0mm · 1.48mm/px · 1 of 34 slices shown (1 of 2)]
[im 1/34]
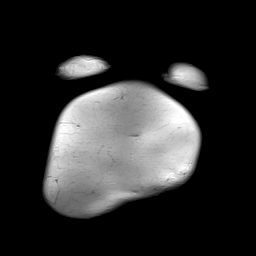

[Series 7: DWI · axial · 6.0mm · 1.49mm/px · z∈[-129,+152]mm · 3 of 80 slices shown (1 of 2)]
[im 1/80]
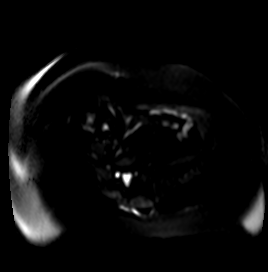
[im 40/80]
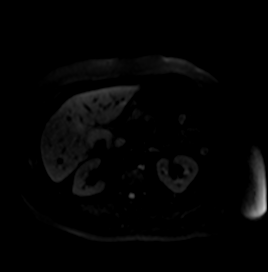
[im 80/80]
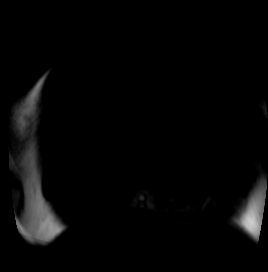

[Series 8: DWI · axial · 6.0mm · 1.49mm/px · 1 of 40 slices shown (2 of 2)]
[im 1/40]
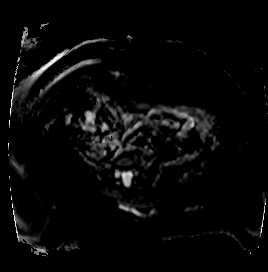

[Series 9: T1 · axial · 3.5mm · 1.19mm/px · z∈[-111,+138]mm · 2 of 72 slices shown (1 of 2)]
[im 1/72]
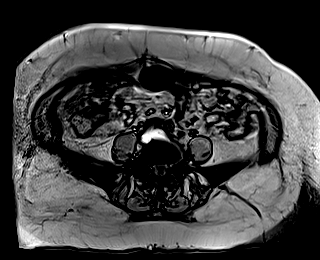
[im 72/72]
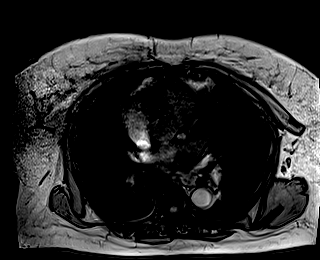

[Series 10: T1 · axial · 3.5mm · 1.19mm/px · z∈[-111,+138]mm · 2 of 72 slices shown (2 of 2)]
[im 1/72]
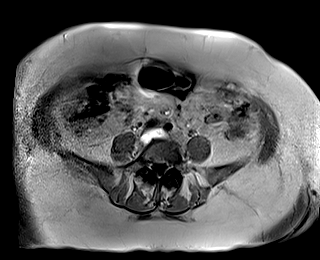
[im 72/72]
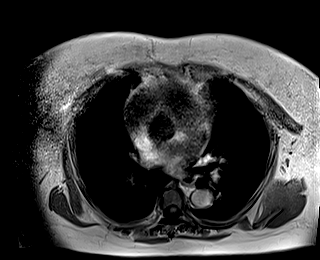

[Series 11: cor obl thk · sagittal · 50.0mm · 0.78mm/px · 1 of 9 slices shown]
[im 1/9]
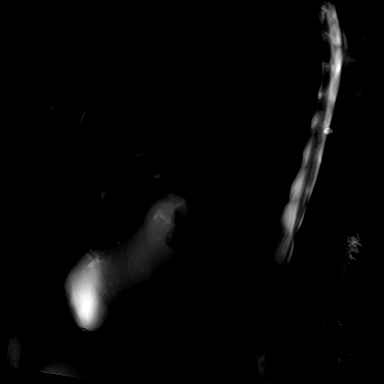

[Series 13: cor_3d_spc_trig · coronal · 1.0mm · 0.49mm/px · 3 of 80 slices shown]
[im 1/80]
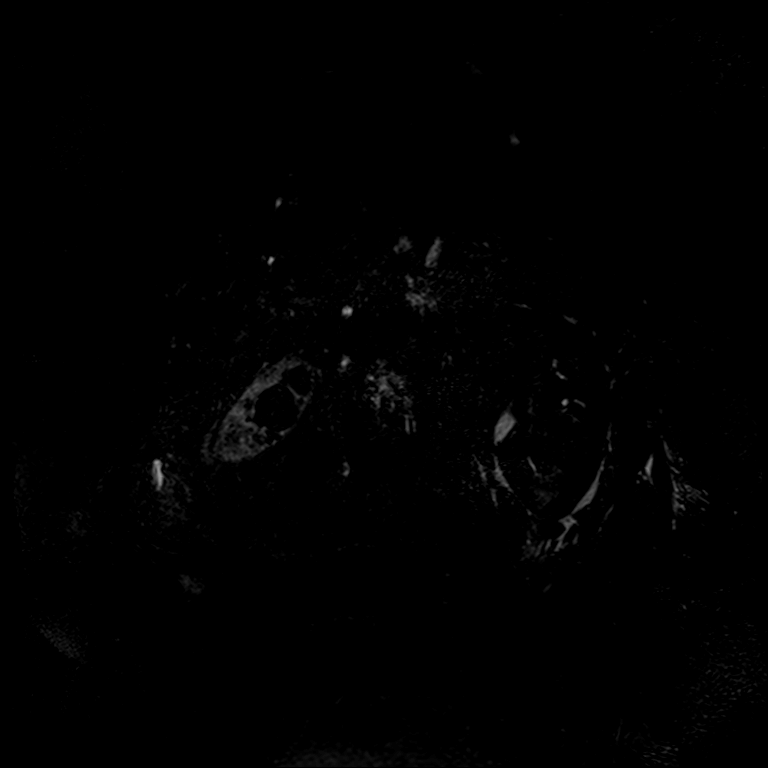
[im 40/80]
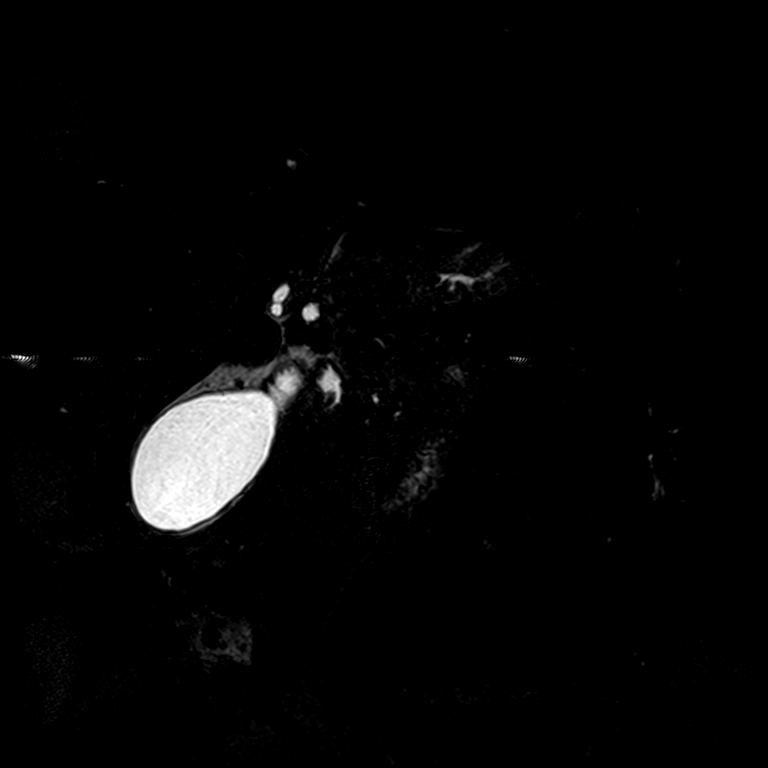
[im 80/80]
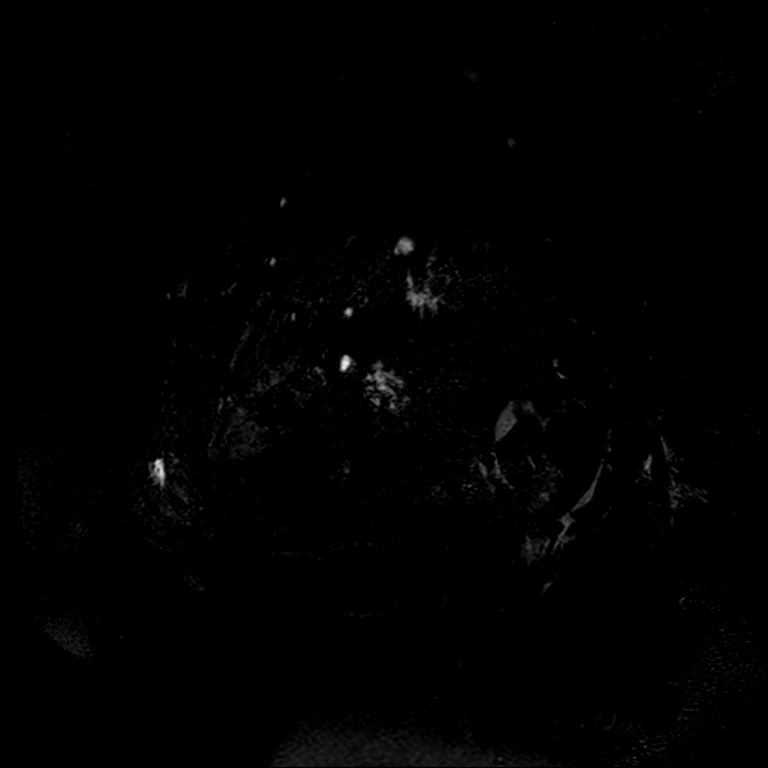

[Series 15: T2 · axial · 6.0mm · 1.48mm/px · 1 of 34 slices shown (2 of 2)]
[im 1/34]
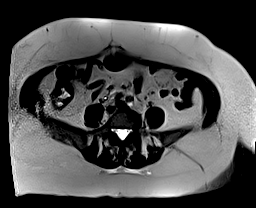

[Series 17: T1 dynamic · axial · 3.0mm · 1.19mm/px · z∈[-116,+121]mm · 3 of 80 slices shown (1 of 7)]
[im 1/80]
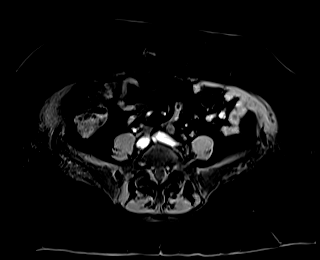
[im 40/80]
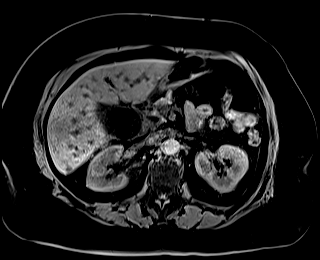
[im 80/80]
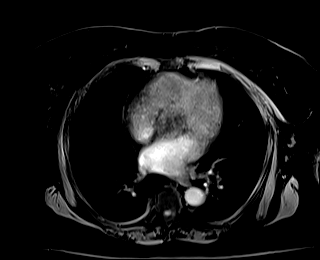

[Series 20: T1 dynamic · axial · 3.0mm · 1.19mm/px · z∈[-116,+121]mm · 3 of 80 slices shown (2 of 7)]
[im 1/80]
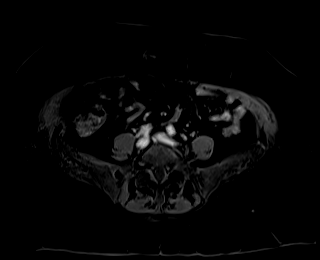
[im 40/80]
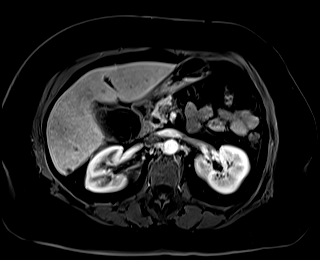
[im 80/80]
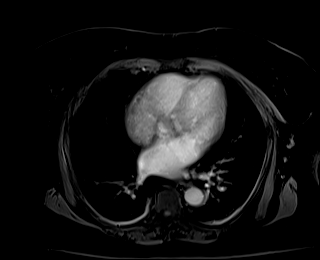

[Series 22: T1 dynamic · axial · 3.0mm · 1.19mm/px · z∈[-116,+121]mm · 3 of 80 slices shown (3 of 7)]
[im 1/80]
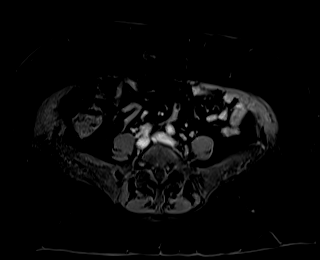
[im 40/80]
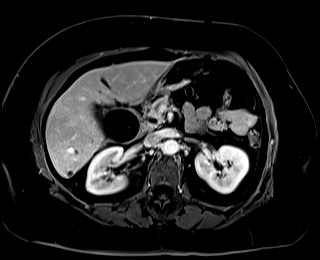
[im 80/80]
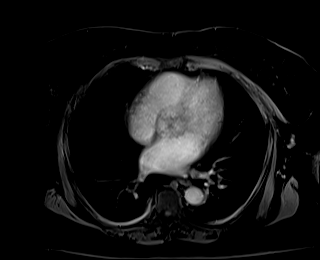

[Series 24: T1 dynamic · axial · 3.0mm · 1.19mm/px · z∈[-116,+121]mm · 3 of 80 slices shown (4 of 7)]
[im 1/80]
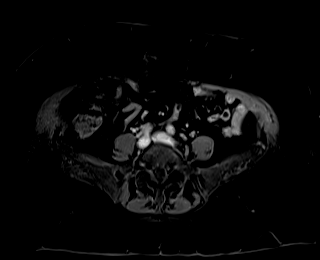
[im 40/80]
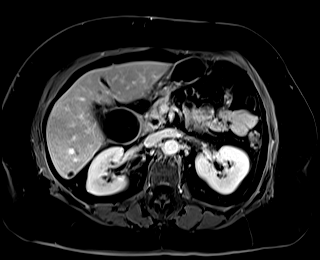
[im 80/80]
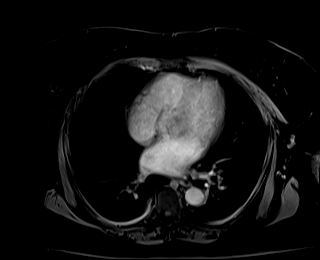

[Series 25: T1 dynamic · coronal · 4.0mm · 1.41mm/px · 2 of 64 slices shown (5 of 7)]
[im 1/64]
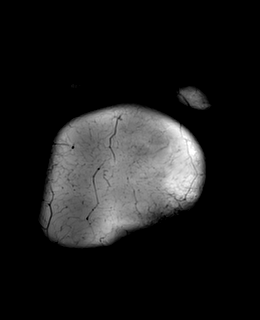
[im 64/64]
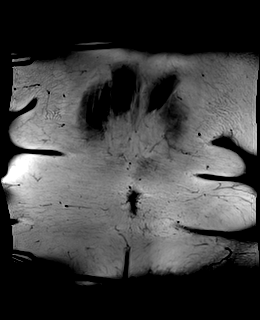

[Series 26: T1 dynamic · coronal · 4.0mm · 1.41mm/px · 2 of 64 slices shown (6 of 7)]
[im 1/64]
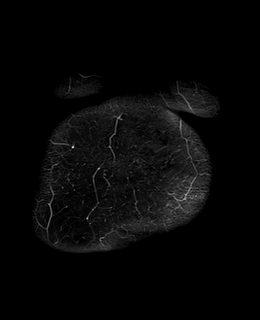
[im 64/64]
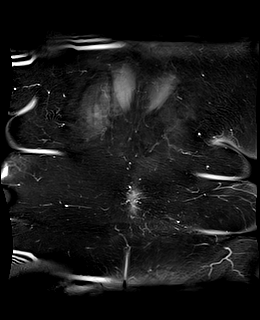

[Series 28: T1 dynamic · axial · 3.0mm · 1.19mm/px · z∈[-116,+1]mm · 2 of 80 slices shown (7 of 7)]
[im 1/80]
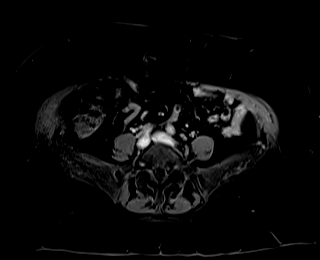
[im 40/80]
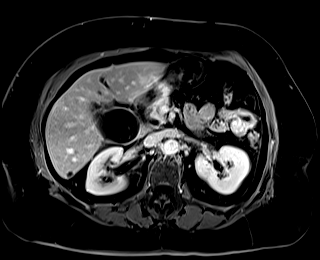

[33 of 48 positions shown; findings below may reference images not displayed]

FINDINGS: Despite efforts by the technologist and patient, motion artifact is
present on today's exam and could not be eliminated. This reduces
exam sensitivity and specificity.

Lower chest: Trace bilateral pleural effusions. Borderline
cardiomegaly.

Hepatobiliary: There are 2 larger gallstones measuring up to 2.5 cm
in diameter in addition to debris or smaller gallstone subtle
dependently in the gallbladder. Mildly abnormal gallbladder wall
thickening along with moderate distention of the gallbladder noted.

There scattered nonenhancing fluid signal hepatic lesions compatible
with cysts. Some of the hepatic lesions demonstrate mild rim
enhancement. At least one of the lesions in segment 7 of the liver
appears to have thicker enhancing margins, measuring about 1.6 by
1.2 cm on image [DATE], and is technically nonspecific. No
well-defined filling defect although there is some adenopathy in the
porta hepatis which may be extrinsically contributing to the
narrowing of the common hepatic duct. I not see a well-defined
cholangiocarcinoma. Presumably given the relative patency of the
common hepatic duct and larger caliber on recent CT, this may
represent some form of spasm or inflammation of the common hepatic
duct.

There is abnormal narrowing of the common hepatic duct. The right
bile duct is dilated but the left hepatic duct is nondilated. This
dilation extends into a region of narrowing of the biliary tree in
the vicinity of the confluence and common hepatic duct which appears
to be new compared to the CT examination of [DATE] p.m.

Pancreas:  Unremarkable

Spleen:  Unremarkable

Adrenals/Urinary Tract:  Unremarkable

Stomach/Bowel: Unremarkable

Vascular/Lymphatic: Aortoiliac atherosclerotic vascular disease.
Portacaval node 1.4 cm in short axis. Porta hepatis/peripancreatic
node 1.3 cm in short axis, image [DATE]. Left retrocrural node 0.8 cm
in short axis, image 38/20.

Other: Nonspecific upper omental nodule or lymph node measuring
cm in long axis image [DATE].

Musculoskeletal: Moderate lumbar spondylosis and degenerative disc
disease.
IMPRESSION: 1. Intrahepatic dilatation with borderline dilation of the common
bile, along duct with abnormal narrowing of the common hepatic duct,
and of the central right bile duct. This narrowing may represent
some form of spasm or inflammation of the common hepatic duct, as
the narrowing represents a change from the CT from [DATE]. I not
see a well-defined cholangiocarcinoma, although there are some
limitations of today's exam due to motion artifact and other
factors. There is some adenopathy in the porta hepatis and
retroperitoneum, which may be extrinsically contributing to the
narrowing of the common hepatic duct. No well-defined filling defect
within the lumen of the extrahepatic biliary tree.
2. Cholelithiasis with abnormal gallbladder wall thickening and
moderate distention. Acute cholecystitis is a distinct possibility.
3. Trace bilateral pleural effusions.
4. Moderate lumbar spondylosis and degenerative disc disease.
5. Scattered liver lesions, some of which are cystic. Several have
thin enhancing margins and may represent biliary hamartomas. A
lesion in segment 7 has a thicker marginal enhancement on all series
and is technically nonspecific. Biliary cystadenoma not excluded.
Micro abscesses or cystic liver metastatic disease not totally
excluded given this unusual enhancement appearance
6. Despite efforts by the technologist and patient, motion artifact
is present on today's exam and could not be eliminated. This reduces
exam sensitivity and specificity.

## 2019-12-03 MED ORDER — SODIUM CHLORIDE 0.9 % IV SOLN
2.0000 g | INTRAVENOUS | Status: DC
  Administered 2019-12-04 – 2019-12-06 (×3): 2 g via INTRAVENOUS
  Filled 2019-12-03 (×2): qty 2
  Filled 2019-12-03: qty 20

## 2019-12-03 MED ORDER — PANTOPRAZOLE SODIUM 40 MG IV SOLR
40.0000 mg | Freq: Every day | INTRAVENOUS | Status: DC
  Administered 2019-12-03 – 2019-12-05 (×3): 40 mg via INTRAVENOUS
  Filled 2019-12-03 (×3): qty 40

## 2019-12-03 MED ORDER — DILTIAZEM HCL ER COATED BEADS 240 MG PO CP24
240.0000 mg | ORAL_CAPSULE | Freq: Every day | ORAL | Status: DC
  Administered 2019-12-03 – 2019-12-06 (×4): 240 mg via ORAL
  Filled 2019-12-03 (×5): qty 1

## 2019-12-03 MED ORDER — MORPHINE SULFATE (PF) 2 MG/ML IV SOLN: 1.0000 mg | INTRAVENOUS | Status: DC | PRN

## 2019-12-03 MED ORDER — ONDANSETRON 4 MG PO TBDP: 4.0000 mg | ORAL_TABLET | Freq: Four times a day (QID) | ORAL | Status: DC | PRN

## 2019-12-03 MED ORDER — HYDROMORPHONE HCL 1 MG/ML IJ SOLN
1.0000 mg | INTRAMUSCULAR | Status: DC | PRN
  Administered 2019-12-03: 1 mg via INTRAVENOUS
  Filled 2019-12-03 (×2): qty 1

## 2019-12-03 MED ORDER — ONDANSETRON HCL 4 MG/2ML IJ SOLN: 4.0000 mg | Freq: Four times a day (QID) | INTRAMUSCULAR | Status: DC | PRN

## 2019-12-03 MED ORDER — METRONIDAZOLE IN NACL 5-0.79 MG/ML-% IV SOLN
500.0000 mg | Freq: Once | INTRAVENOUS | Status: AC
  Administered 2019-12-03: 500 mg via INTRAVENOUS
  Filled 2019-12-03: qty 100

## 2019-12-03 MED ORDER — KCL IN DEXTROSE-NACL 20-5-0.9 MEQ/L-%-% IV SOLN
INTRAVENOUS | Status: DC
  Administered 2019-12-03: 100 mL/h via INTRAVENOUS
  Filled 2019-12-03 (×6): qty 1000

## 2019-12-03 MED ORDER — HYDROMORPHONE HCL 1 MG/ML IJ SOLN
1.0000 mg | Freq: Once | INTRAMUSCULAR | Status: AC
  Administered 2019-12-03: 1 mg via INTRAVENOUS
  Filled 2019-12-03: qty 1

## 2019-12-03 MED ORDER — SODIUM CHLORIDE 0.9 % IV SOLN
2.0000 g | INTRAVENOUS | Status: DC
  Administered 2019-12-03: 2 g via INTRAVENOUS
  Filled 2019-12-03: qty 20

## 2019-12-03 MED ORDER — GADOBUTROL 1 MMOL/ML IV SOLN
10.0000 mL | Freq: Once | INTRAVENOUS | Status: AC | PRN
  Administered 2019-12-03: 10 mL via INTRAVENOUS

## 2019-12-03 MED ORDER — HYDROCHLOROTHIAZIDE 12.5 MG PO CAPS
12.5000 mg | ORAL_CAPSULE | Freq: Every day | ORAL | Status: DC
  Administered 2019-12-03 – 2019-12-06 (×4): 12.5 mg via ORAL
  Filled 2019-12-03 (×4): qty 1

## 2019-12-03 MED ORDER — SODIUM CHLORIDE 0.9 % IV SOLN
1.0000 g | Freq: Once | INTRAVENOUS | Status: AC
  Administered 2019-12-03: 1 g via INTRAVENOUS
  Filled 2019-12-03: qty 10

## 2019-12-03 NOTE — ED Notes (Signed)
Assisted pt to bathroom.  Tolerated well

## 2019-12-03 NOTE — ED Provider Notes (Signed)
1:35 AM  Pt transferred form [facility] for CT findings concerning for acute cholecystitis.  Rcvd IV Rocephin and Flagyl.  Dr. Carolynne Edouard aware.  Will page to let him know pt in ED.  1:50 AM  Spoke with Dr. Carolynne Edouard.  He will see in ED.  Will give patient additional pain meds.  I reviewed all nursing notes and pertinent previous records as available.  I have reviewed and interpreted any EKGs, lab and urine results, imaging (as available).    Annaya Bangert, Layla Maw, DO 12/03/19 [phone]

## 2019-12-03 NOTE — H&P (Signed)
Audrey Bennett is an 64 y.o. female.   Chief Complaint: Abdominal pain HPI: The patient is a 64 year old black female who began having right upper quadrant abdominal pain on Monday.  The pain has seemed to come and go at times since then.  The pain is been associated with nausea and vomiting.  She denies any fevers or chills.  The pain came back again yesterday so she came to the emergency department.  A CT scan showed 2 large stones in the gallbladder with some possible inflammation of the gallbladder wall and significant dilation of the intra and extrahepatic bile ducts.  Only one of her liver functions was slightly elevated.  Past Medical History:  Diagnosis Date  . Hypertension   . SVT (supraventricular tachycardia) (HCC)     Past Surgical History:  Procedure Laterality Date  . BREAST SURGERY      No family history on file. Social History:  reports that she has never smoked. She has never used smokeless tobacco. She reports that she does not drink alcohol and does not use drugs.  Allergies:  Allergies  Allergen Reactions  . Ace Inhibitors     Lisinopril    (Not in a hospital admission)   Results for orders placed or performed during the hospital encounter of 12/02/19 (from the past 48 hour(s))  CBC with Differential/Platelet     Status: Abnormal   Collection Time: 12/02/19  9:41 PM  Result Value Ref Range   WBC 8.3 4.0 - 10.5 K/uL   RBC 5.23 (H) 3.87 - 5.11 MIL/uL   Hemoglobin 13.9 12.0 - 15.0 g/dL   HCT 32.9 36 - 46 %   MCV 84.7 80.0 - 100.0 fL   MCH 26.6 26.0 - 34.0 pg   MCHC 31.4 30.0 - 36.0 g/dL   RDW 92.4 26.8 - 34.1 %   Platelets 334 150 - 400 K/uL   nRBC 0.0 0.0 - 0.2 %   Neutrophils Relative % 66 %   Neutro Abs 5.4 1.7 - 7.7 K/uL   Lymphocytes Relative 23 %   Lymphs Abs 1.9 0.7 - 4.0 K/uL   Monocytes Relative 9 %   Monocytes Absolute 0.8 0 - 1 K/uL   Eosinophils Relative 2 %   Eosinophils Absolute 0.2 0 - 0 K/uL   Basophils Relative 0 %   Basophils  Absolute 0.0 0 - 0 K/uL   Immature Granulocytes 0 %   Abs Immature Granulocytes 0.03 0.00 - 0.07 K/uL    Comment: Performed at [facility], [address] [place], [place] [address]  Comprehensive metabolic panel     Status: Abnormal   Collection Time: 12/02/19  9:41 PM  Result Value Ref Range   Sodium 137 135 - 145 mmol/L   Potassium 3.8 3.5 - 5.1 mmol/L    Comment: SLIGHT HEMOLYSIS   Chloride 101 98 - 111 mmol/L   CO2 23 22 - 32 mmol/L   Glucose, Bld 177 (H) 70 - 99 mg/dL    Comment: Glucose reference range applies only to samples taken after fasting for at least 8 hours.   BUN 13 8 - 23 mg/dL   Creatinine, Ser 9.79 0.44 - 1.00 mg/dL   Calcium 8.6 (L) 8.9 - 10.3 mg/dL   Total Protein 8.6 (H) 6.5 - 8.1 g/dL   Albumin 3.5 3.5 - 5.0 g/dL   AST 76 (H) 15 - 41 U/L   ALT 34 0 - 44 U/L   Alkaline Phosphatase 109 38 -  126 U/L   Total Bilirubin 1.2 0.3 - 1.2 mg/dL   GFR calc non Af Amer >60 >60 mL/min   GFR calc Af Amer >60 >60 mL/min   Anion gap 13 5 - 15    Comment: Performed at [facility], [address] [place], [place] [address]  Lipase, blood     Status: Abnormal   Collection Time: 12/02/19  9:41 PM  Result Value Ref Range   Lipase 166 (H) 11 - 51 U/L    Comment: Performed at [facility], [address] [place], [place] [address]  Troponin I (High Sensitivity)     Status: None   Collection Time: 12/02/19  9:43 PM  Result Value Ref Range   Troponin I (High Sensitivity) 4 <18 ng/L    Comment: (NOTE) Elevated high sensitivity troponin I (hsTnI) values and significant  changes across serial measurements may suggest ACS but many other  chronic and acute conditions are known to elevate hsTnI results.  Refer to the "Links" section for chest pain algorithms and additional  guidance. Performed at [facility], [address] [place], [place] [address]   SARS Coronavirus 2 by RT PCR (hospital order, performed in [facility]  hospital lab) Nasopharyngeal Nasopharyngeal Swab     Status: None   Collection Time: 12/02/19 11:21 PM   Specimen: Nasopharyngeal Swab  Result Value Ref Range   SARS Coronavirus 2 NEGATIVE NEGATIVE    Comment: (NOTE) SARS-CoV-2 target nucleic acids are NOT DETECTED.  The SARS-CoV-2 RNA is generally detectable in upper and lower respiratory specimens during the acute phase of infection. The lowest concentration of SARS-CoV-2 viral copies this assay can detect is 250 copies / mL. A negative result does not preclude SARS-CoV-2 infection and should not be used as the sole basis for treatment or other patient management decisions.  A negative result may occur with improper specimen collection / handling, submission of specimen other than nasopharyngeal swab, presence of viral mutation(s) within the areas targeted by this assay, and inadequate number of viral copies (<250 copies / mL). A negative result must be combined with clinical observations, patient history, and epidemiological information.  Fact Sheet for Patients:   BoilerBrush.com.cy  Fact Sheet for Healthcare Providers: https://pope.com/  This test is not yet approved or  cleared by the [place] FDA and has been authorized for detection and/or diagnosis of SARS-CoV-2 by FDA under an Emergency Use Authorization (EUA).  This EUA will remain in effect (meaning this test can be used) for the duration of the COVID-19 declaration under Section 564(b)(1) of the Act, 21 U.S.C. section 360bbb-3(b)(1), unless the authorization is terminated or revoked sooner.  Performed at [facility], [address] [place], [place] [address]   Urinalysis, Routine w reflex microscopic     Status: None   Collection Time: 12/03/19 12:30 AM  Result Value Ref Range   Color, Urine YELLOW YELLOW   APPearance CLEAR CLEAR   Specific Gravity, Urine 1.010 1.005 - 1.030   pH 5.5 5.0 - 8.0    Glucose, UA NEGATIVE NEGATIVE mg/dL   Hgb urine dipstick NEGATIVE NEGATIVE   Bilirubin Urine NEGATIVE NEGATIVE   Ketones, ur NEGATIVE NEGATIVE mg/dL   Protein, ur NEGATIVE NEGATIVE mg/dL   Nitrite NEGATIVE NEGATIVE   Leukocytes,Ua NEGATIVE NEGATIVE    Comment: Microscopic not done on urines with negative protein, blood, leukocytes, nitrite, or glucose < 500 mg/dL. Performed at [facility]  [address] [employer] [place], [place] [address]   Lipase, blood     Status: Abnormal   Collection Time: 12/03/19  5:53 AM  Result Value Ref Range   Lipase 296 (H) 11 - 51 U/L    Comment: Performed at [facility], [address], [place], [place] [address]   CT ABDOMEN PELVIS W CONTRAST  Result Date: 12/02/2019 CLINICAL DATA:  Mid abdominal pain with vomiting EXAM: CT ABDOMEN AND PELVIS WITH CONTRAST TECHNIQUE: Multidetector CT imaging of the abdomen and pelvis was performed using the standard protocol following bolus administration of intravenous contrast. CONTRAST:  OMNIPAQUE IOHEXOL 300 MG/ML  SOLN COMPARISON:  None. FINDINGS: Lower chest: Atelectatic changes present in the otherwise clear lung bases. Cardiac size at the upper limits of normal. No pericardial effusion. Hepatobiliary: Multiple fluid attenuation cysts present throughout the liver. Normal hepatic attenuation. Smooth liver surface contour. Portal and hepatic veins appear patent. There is moderate intra and extrahepatic biliary ductal dilatation. There is distal tapering at the level of the ampulla of Vater. The gallbladder appears distended with some mild hazy mural thickening up to 4 mm and some trace pericholecystic fluid in the gallbladder fossa (5/39). Gallbladder contains at least 2 large partially calcified gallstones measuring up to 2.4 cm in maximal diameter. Pancreas: Tapering of the distal common bile duct at the level of the pancreatic head. No pancreatic ductal dilatation. No focal inflammation or  discernible pancreatic lesions. Spleen: Normal in size without focal abnormality. Adrenals/Urinary Tract: Normal adrenal glands. Kidneys enhance and excrete symmetrically. No concerning renal mass. No obstructive urolithiasis or hydronephrosis. Punctate nonobstructing calculus seen in the lower pole right kidney. Urinary bladder is unremarkable. Stomach/Bowel: Irregular contour of the greater curvature may reflect an air and fluid containing gastric diverticulum with a more normal appearance of the distal stomach. Duodenum takes a normal sweep across the midline abdomen. No small bowel thickening or dilatation. Elongated, air-filled appendix coursing in a retrocecal position terminating anterior to the sacrum. No adjacent inflammation. No colonic dilatation or wall thickening. Vascular/Lymphatic: Atherosclerotic calcifications within the abdominal aorta and branch vessels. No aneurysm or ectasia. No enlarged abdominopelvic lymph nodes. Reproductive: Anteverted uterus.  No concerning adnexal lesions. Other: No abdominopelvic free fluid or free gas. No bowel containing hernias. Musculoskeletal: No acute osseous abnormality or suspicious osseous lesion. Multilevel degenerative changes are present in the imaged portions of the spine. Additional degenerative changes in the hips and pelvis with sclerotic features of the SI joints which could reflect sequela of osteitis condensans ilii. IMPRESSION: 1. The gallbladder appears distended with some mild hazy mural thickening up to 4 mm and some trace pericholecystic fluid in the gallbladder fossa. Findings are concerning for acute cholecystitis. 2. Additional moderate intra and extrahepatic biliary ductal dilatation. There is distal tapering at the level of the Ampulla of Vater. Could be better assessed with right upper quadrant ultrasound. 3. Punctate nonobstructing right nephrolithiasis. 4. Irregular contour of the proximal greater curvature of the stomach may reflect an  noninflamed gastric diverticulum. 5. Aortic Atherosclerosis (ICD10-I70.0). Electronically Signed   By: Kreg Shropshire M.D.   On: 12/02/2019 23:11   DG Chest Port 1 View  Result Date: 12/02/2019 CLINICAL DATA:  Shortness of breath, nausea, vomiting and abdominal pain EXAM: PORTABLE CHEST 1 VIEW COMPARISON:  Radiograph 05/28/2017 FINDINGS: Low lung volumes with basilar atelectasis. No consolidation, features of edema, pneumothorax, or effusion. Cardiac size within normal limits for portable technique. The aorta is calcified. The remaining cardiomediastinal contours  are unremarkable. No acute osseous or soft tissue abnormality. Degenerative changes are present in the imaged spine and shoulders. Telemetry leads overlie the chest. IMPRESSION: Low volumes and atelectasis. No other acute cardiopulmonary abnormality. Electronically Signed   By: Kreg ShropshirePrice  DeHay M.D.   On: 12/02/2019 22:19    Review of Systems  Constitutional: Negative.   HENT: Negative.   Eyes: Negative.   Respiratory: Negative.   Cardiovascular: Negative.   Gastrointestinal: Positive for abdominal pain, nausea and vomiting.  Endocrine: Negative.   Genitourinary: Negative.   Musculoskeletal: Negative.   Skin: Negative.   Allergic/Immunologic: Negative.   Neurological: Negative.   Hematological: Negative.   Psychiatric/Behavioral: Negative.     Blood pressure 137/70, pulse 96, temperature 98.4 F (36.9 C), temperature source Oral, resp. rate 16, height 5' (1.524 m), weight 77.1 kg, SpO2 94 %. Physical Exam Constitutional:      General: She is not in acute distress.    Appearance: Normal appearance. She is not ill-appearing.  HENT:     Head: Normocephalic and atraumatic.     Right Ear: External ear normal.     Left Ear: External ear normal.     Nose: Nose normal.  Eyes:     General: No scleral icterus.    Extraocular Movements: Extraocular movements intact.     Pupils: Pupils are equal, round, and reactive to light.   Cardiovascular:     Rate and Rhythm: Normal rate and regular rhythm.     Pulses: Normal pulses.     Heart sounds: Normal heart sounds.     Comments: No pitting edema of lower extremities Pulmonary:     Effort: Pulmonary effort is normal. No respiratory distress.     Breath sounds: Normal breath sounds.  Abdominal:     General: Abdomen is flat. There is no distension.     Palpations: Abdomen is soft.     Tenderness: There is abdominal tenderness.     Comments: Mild RUQ tenderness  Musculoskeletal:        General: No swelling, tenderness or deformity. Normal range of motion.     Cervical back: Normal range of motion and neck supple. No tenderness.  Lymphadenopathy:     Cervical: No cervical adenopathy.     Comments: No groin or cervical lymphadenopathy  Skin:    General: Skin is warm and dry.     Findings: No rash.  Neurological:     General: No focal deficit present.     Mental Status: She is alert and oriented to person, place, and time.  Psychiatric:        Mood and Affect: Mood normal.        Behavior: Behavior normal.      Assessment/Plan The patient appears to have symptomatic gallstones with possible cholecystitis and pancreatitis.  At this point we will admit her for bowel rest and allow the pancreatitis to resolve.  Given the level of intra and extrahepatic bile duct dilation I will also get an MRCP to rule out any lesions in the head of the pancreas.  If this is negative and her pancreatitis resolves then she will probably benefit from having her gallbladder removed during this hospitalization.  Chevis PrettyPaul Toth III, MD 12/03/2019, 6:56 AM

## 2019-12-03 NOTE — ED Notes (Signed)
Dr Toth at bedside. 

## 2019-12-03 NOTE — ED Notes (Signed)
Report called at 1410, as indicated by purple man to [place], Charity fundraiser.

## 2019-12-03 NOTE — Plan of Care (Signed)

## 2019-12-04 LAB — COMPREHENSIVE METABOLIC PANEL
ALT: 79 U/L — ABNORMAL HIGH (ref 0–44)
AST: 82 U/L — ABNORMAL HIGH (ref 15–41)
Albumin: 2.9 g/dL — ABNORMAL LOW (ref 3.5–5.0)
Alkaline Phosphatase: 184 U/L — ABNORMAL HIGH (ref 38–126)
Anion gap: 9 (ref 5–15)
BUN: 9 mg/dL (ref 8–23)
CO2: 30 mmol/L (ref 22–32)
Calcium: 8.3 mg/dL — ABNORMAL LOW (ref 8.9–10.3)
Chloride: 104 mmol/L (ref 98–111)
Creatinine, Ser: 0.66 mg/dL (ref 0.44–1.00)
GFR calc Af Amer: >60 mL/min (ref >60)
GFR calc non Af Amer: >60 mL/min (ref >60)
Glucose, Bld: 126 mg/dL — ABNORMAL HIGH (ref 70–99)
Potassium: 3.2 mmol/L — ABNORMAL LOW (ref 3.5–5.1)
Sodium: 143 mmol/L (ref 135–145)
Total Bilirubin: 3 mg/dL — ABNORMAL HIGH (ref 0.3–1.2)
Total Protein: 7.9 g/dL (ref 6.5–8.1)

## 2019-12-04 LAB — CBC
HCT: 42.6 % (ref 36.0–46.0)
Hemoglobin: 13.5 g/dL (ref 12.0–15.0)
MCH: 27.2 pg (ref 26.0–34.0)
MCHC: 31.7 g/dL (ref 30.0–36.0)
MCV: 85.7 fL (ref 80.0–100.0)
Platelets: 274 10*3/uL (ref 150–400)
RBC: 4.97 MIL/uL (ref 3.87–5.11)
RDW: 14.6 % (ref 11.5–15.5)
WBC: 5.7 10*3/uL (ref 4.0–10.5)
nRBC: 0 % (ref 0.0–0.2)

## 2019-12-04 LAB — LIPASE, BLOOD: Lipase: 28 U/L (ref 11–51)

## 2019-12-04 NOTE — Plan of Care (Signed)
  Problem: Education: Goal: Knowledge of General Education information will improve Description: Including pain rating scale, medication(s)/side effects and non-pharmacologic comfort measures Outcome: Progressing   Problem: Activity: Goal: Risk for activity intolerance will decrease Outcome: Progressing   Problem: Elimination: Goal: Will not experience complications related to urinary retention Outcome: Progressing   Problem: Pain Managment: Goal: General experience of comfort will improve Outcome: Progressing   

## 2019-12-04 NOTE — Progress Notes (Signed)
CC: Abdominal pain  Subjective: Patient is resting comfortably.  She says she does not have any pain but she is extremely tender in the right upper quadrant on palpation.  Objective: Vital signs in last 24 hours: Temp:  [98.4 F (36.9 C)-100.4 F (38 C)] 99.6 F (37.6 C) (07/06 0554) Pulse Rate:  [75-96] 80 (07/06 0554) Resp:  [16] 16 (07/06 0554) BP: (110-139)/(54-75) 139/70 (07/06 0554) SpO2:  [89 %-98 %] 92 % (07/06 0554) Last BM Date: 12/02/19 N.p.o. 629 IV recorded . cefTRIAXone (ROCEPHIN)  IV    . dextrose 5 % and 0.9 % NaCl with KCl 20 mEq/L 100 mL/hr at 12/03/19 1430  Urine x2 recorded no BM recorded T-max 100.4. Lipase 166>> 296  No other labs present for this AM.  MRCP 7/5: Intrahepatic dilatation with borderline dilatation of the common bile duct with abnormal narrowing of the common hepatic duct and central bile duct.  This narrowing may represent some form of spasm or inflammation of the common hepatic duct.  The narrowing is a change from the CT on 7/4/202.  A well defined cholangiocarcinoma was not seen although there were some limitations due to motion artifact.  There is also some adenopathy in the porta hepatis and retroperitoneum which could be contributing to extrinsic narrowing of the common hepatic duct.  No well-defined filling defect within the lumen of the biliary tree.  Cholelithiasis with abnormal wall thickening, moderate distention, possible acute cholecystitis. Do it every   Intake/Output from previous day: 07/05 0701 - 07/06 0700 In: 629 [I.V.:529; IV Piggyback:100] Out: 0  Intake/Output this shift: No intake/output data recorded.  General appearance: alert, cooperative and no distress Resp: Clear anterior exam GI: Soft, extremely tender to palpation in the right upper quadrant.  No nausea, vomiting, or diarrhea  Lab Results:  Recent Labs    12/02/19 2141  WBC 8.3  HGB 13.9  HCT 44.3  PLT 334    BMET Recent Labs     12/02/19 2141  NA 137  K 3.8  CL 101  CO2 23  GLUCOSE 177*  BUN 13  CREATININE 0.70  CALCIUM 8.6*   PT/INR No results for input(s): LABPROT, INR in the last 72 hours.  Recent Labs  Lab 12/02/19 2141  AST 76*  ALT 34  ALKPHOS 109  BILITOT 1.2  PROT 8.6*  ALBUMIN 3.5     Lipase     Component Value Date/Time   LIPASE 296 (H) 12/03/2019 0553     Prior to Admission medications   Medication Sig Start Date End Date Taking? Authorizing Provider  diltiazem (DILACOR XR) 240 MG 24 hr capsule Take 240 mg by mouth daily.   Yes [provider]  hydrochlorothiazide (MICROZIDE) 12.5 MG capsule Take 12.5 mg by mouth daily.   Yes [provider]  ibuprofen (ADVIL) 800 MG tablet Take 800 mg by mouth every 8 (eight) hours as needed for headache, mild pain, moderate pain or cramping.   Yes [provider]  oseltamivir (TAMIFLU) 75 MG capsule Take 1 capsule (75 mg total) by mouth every 12 (twelve) hours. Patient not taking: Reported on 12/03/2019 05/28/17   Dartha Lodge, PA-C    Medications: . diltiazem  240 mg Oral Daily  . hydrochlorothiazide  12.5 mg Oral Daily  . pantoprazole (PROTONIX) IV  40 mg Intravenous QHS   . cefTRIAXone (ROCEPHIN)  IV    . dextrose 5 % and 0.9 % NaCl with KCl 20 mEq/L 100 mL/hr at  12/03/19 1430    Assessment/Plan Hx of hypertension Hx SVT  Pancreatitis Symptomatic cholelithiasis with possible cholecystitis  FEN: IV fluids/n.p.o. ID: Rocephin 7/5>> day 2; Flagyl x1 7/5 DVT: SCDs we will Follow up: TBD   Plan: Continue antibiotics, IV fluids for now, n.p.o.; review MRCP with Dr. Byerly.  She will most likely need GI consult, EUS and biopsy.   LOS: 1 day    Pavlos Yon 12/04/2019 Please see Amion  

## 2019-12-04 NOTE — Plan of Care (Signed)
  Problem: Education: Goal: Knowledge of General Education information will improve Description: Including pain rating scale, medication(s)/side effects and non-pharmacologic comfort measures Outcome: Progressing   Problem: Coping: Goal: Level of anxiety will decrease Outcome: Progressing   

## 2019-12-04 NOTE — H&P (View-Only) (Signed)
CC: Abdominal pain  Subjective: Patient is resting comfortably.  She says she does not have any pain but she is extremely tender in the right upper quadrant on palpation.  Objective: Vital signs in last 24 hours: Temp:  [98.4 F (36.9 C)-100.4 F (38 C)] 99.6 F (37.6 C) (07/06 0554) Pulse Rate:  [75-96] 80 (07/06 0554) Resp:  [16] 16 (07/06 0554) BP: (110-139)/(54-75) 139/70 (07/06 0554) SpO2:  [89 %-98 %] 92 % (07/06 0554) Last BM Date: 12/02/19 N.p.o. 629 IV recorded . cefTRIAXone (ROCEPHIN)  IV    . dextrose 5 % and 0.9 % NaCl with KCl 20 mEq/L 100 mL/hr at 12/03/19 1430  Urine x2 recorded no BM recorded T-max 100.4. Lipase 166>> 296  No other labs present for this AM.  MRCP 7/5: Intrahepatic dilatation with borderline dilatation of the common bile duct with abnormal narrowing of the common hepatic duct and central bile duct.  This narrowing may represent some form of spasm or inflammation of the common hepatic duct.  The narrowing is a change from the CT on 7/4/202.  A well defined cholangiocarcinoma was not seen although there were some limitations due to motion artifact.  There is also some adenopathy in the porta hepatis and retroperitoneum which could be contributing to extrinsic narrowing of the common hepatic duct.  No well-defined filling defect within the lumen of the biliary tree.  Cholelithiasis with abnormal wall thickening, moderate distention, possible acute cholecystitis. Do it every   Intake/Output from previous day: 07/05 0701 - 07/06 0700 In: 629 [I.V.:529; IV Piggyback:100] Out: 0  Intake/Output this shift: No intake/output data recorded.  General appearance: alert, cooperative and no distress Resp: Clear anterior exam GI: Soft, extremely tender to palpation in the right upper quadrant.  No nausea, vomiting, or diarrhea  Lab Results:  Recent Labs    12/02/19 2141  WBC 8.3  HGB 13.9  HCT 44.3  PLT 334    BMET Recent Labs     12/02/19 2141  NA 137  K 3.8  CL 101  CO2 23  GLUCOSE 177*  BUN 13  CREATININE 0.70  CALCIUM 8.6*   PT/INR No results for input(s): LABPROT, INR in the last 72 hours.  Recent Labs  Lab 12/02/19 2141  AST 76*  ALT 34  ALKPHOS 109  BILITOT 1.2  PROT 8.6*  ALBUMIN 3.5     Lipase     Component Value Date/Time   LIPASE 296 (H) 12/03/2019 0553     Prior to Admission medications   Medication Sig Start Date End Date Taking? Authorizing Provider  diltiazem (DILACOR XR) 240 MG 24 hr capsule Take 240 mg by mouth daily.   Yes [provider]  hydrochlorothiazide (MICROZIDE) 12.5 MG capsule Take 12.5 mg by mouth daily.   Yes [provider]  ibuprofen (ADVIL) 800 MG tablet Take 800 mg by mouth every 8 (eight) hours as needed for headache, mild pain, moderate pain or cramping.   Yes [provider]  oseltamivir (TAMIFLU) 75 MG capsule Take 1 capsule (75 mg total) by mouth every 12 (twelve) hours. Patient not taking: Reported on 12/03/2019 05/28/17   Dartha Lodge, PA-C    Medications: . diltiazem  240 mg Oral Daily  . hydrochlorothiazide  12.5 mg Oral Daily  . pantoprazole (PROTONIX) IV  40 mg Intravenous QHS   . cefTRIAXone (ROCEPHIN)  IV    . dextrose 5 % and 0.9 % NaCl with KCl 20 mEq/L 100 mL/hr at  12/03/19 1430    Assessment/Plan Hx of hypertension Hx SVT  Pancreatitis Symptomatic cholelithiasis with possible cholecystitis  FEN: IV fluids/n.p.o. ID: Rocephin 7/5>> day 2; Flagyl x1 7/5 DVT: SCDs we will Follow up: TBD   Plan: Continue antibiotics, IV fluids for now, n.p.o.; review MRCP with Dr. Donell Beers.  She will most likely need GI consult, EUS and biopsy.   LOS: 1 day    Audrey Bennett 12/04/2019 Please see Amion

## 2019-12-05 ENCOUNTER — Encounter (HOSPITAL_COMMUNITY): Payer: Self-pay

## 2019-12-05 ENCOUNTER — Encounter (HOSPITAL_COMMUNITY): Admission: EM | Disposition: A | Discharge status: Home / Self Care | Payer: Self-pay | Source: Home / Self Care

## 2019-12-05 ENCOUNTER — Inpatient Hospital Stay (HOSPITAL_COMMUNITY): Payer: BC Managed Care – PPO | Admitting: Anesthesiology

## 2019-12-05 ENCOUNTER — Inpatient Hospital Stay (HOSPITAL_COMMUNITY): Payer: BC Managed Care – PPO

## 2019-12-05 HISTORY — PX: CHOLECYSTECTOMY: SHX55

## 2019-12-05 LAB — CBC
HCT: 42.3 % (ref 36.0–46.0)
Hemoglobin: 13.3 g/dL (ref 12.0–15.0)
MCH: 26.9 pg (ref 26.0–34.0)
MCHC: 31.4 g/dL (ref 30.0–36.0)
MCV: 85.6 fL (ref 80.0–100.0)
Platelets: 308 10*3/uL (ref 150–400)
RBC: 4.94 MIL/uL (ref 3.87–5.11)
RDW: 14.6 % (ref 11.5–15.5)
WBC: 5.7 10*3/uL (ref 4.0–10.5)
nRBC: 0 % (ref 0.0–0.2)

## 2019-12-05 LAB — COMPREHENSIVE METABOLIC PANEL
ALT: 62 U/L — ABNORMAL HIGH (ref 0–44)
AST: 48 U/L — ABNORMAL HIGH (ref 15–41)
Albumin: 2.7 g/dL — ABNORMAL LOW (ref 3.5–5.0)
Alkaline Phosphatase: 162 U/L — ABNORMAL HIGH (ref 38–126)
Anion gap: 14 (ref 5–15)
BUN: 9 mg/dL (ref 8–23)
CO2: 27 mmol/L (ref 22–32)
Calcium: 8.7 mg/dL — ABNORMAL LOW (ref 8.9–10.3)
Chloride: 102 mmol/L (ref 98–111)
Creatinine, Ser: 0.58 mg/dL (ref 0.44–1.00)
GFR calc Af Amer: >60 mL/min (ref >60)
GFR calc non Af Amer: >60 mL/min (ref >60)
Glucose, Bld: 111 mg/dL — ABNORMAL HIGH (ref 70–99)
Potassium: 3.9 mmol/L (ref 3.5–5.1)
Sodium: 143 mmol/L (ref 135–145)
Total Bilirubin: 1.4 mg/dL — ABNORMAL HIGH (ref 0.3–1.2)
Total Protein: 7.6 g/dL (ref 6.5–8.1)

## 2019-12-05 LAB — CANCER ANTIGEN 19-9: CA 19-9: 161 U/mL — ABNORMAL HIGH (ref 0–35)

## 2019-12-05 LAB — SURGICAL PCR SCREEN
MRSA, PCR: NEGATIVE
Staphylococcus aureus: NEGATIVE

## 2019-12-05 IMAGING — RF DG CHOLANGIOGRAM OPERATIVE
1 series · 8 of 8 positions shown · non-contrast
Comparison: MRCP-[DATE]

CLINICAL DATA: Intraoperative cholangiogram during laparoscopic
cholecystectomy.

EXAM:
INTRAOPERATIVE CHOLANGIOGRAM
FLUOROSCOPY TIME:  25 seconds

[Series 1: run · 2 acquisitions, 8 frames shown]
[im 1/2]
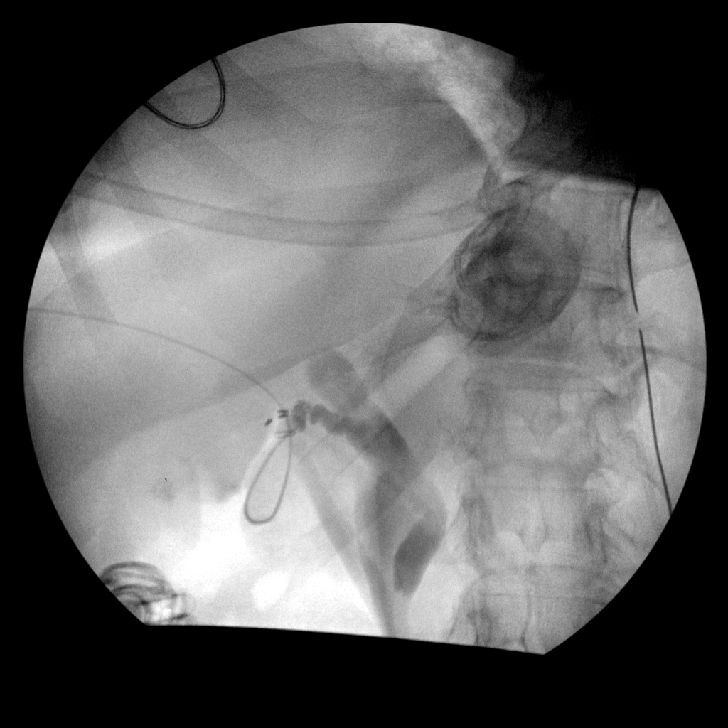
[im 1/2]
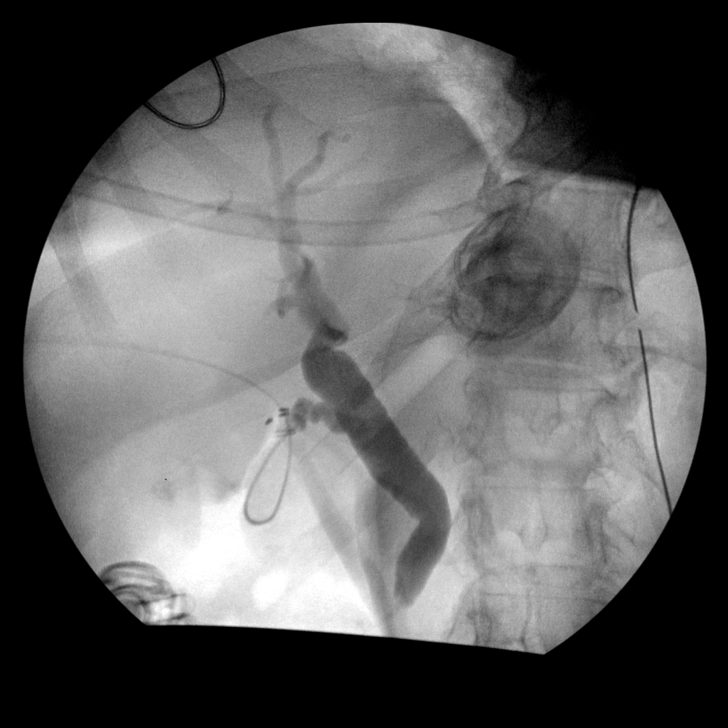
[im 1/2]
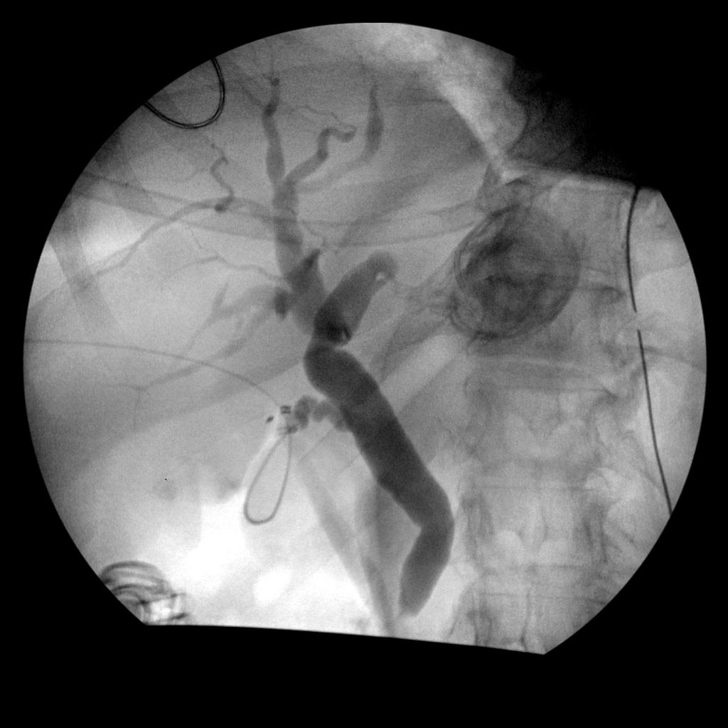
[im 1/2]
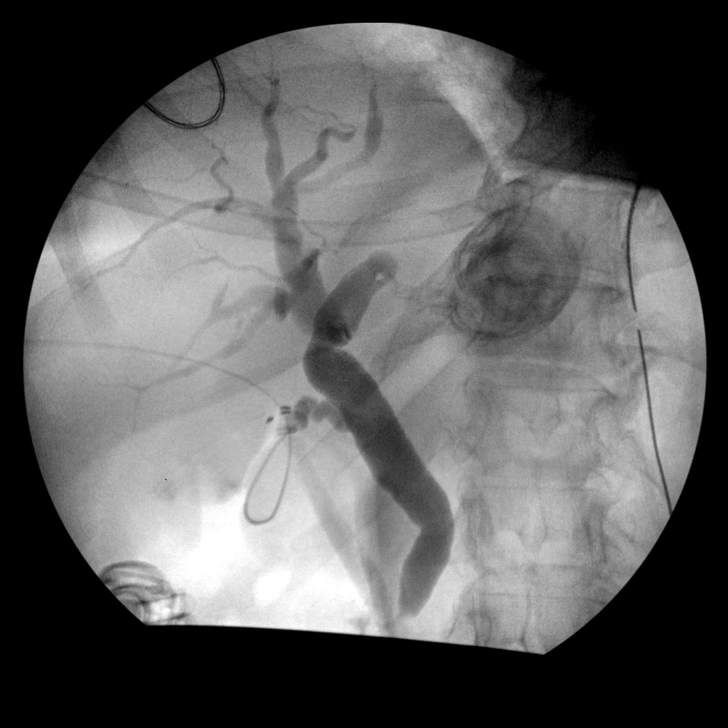
[im 2/2]
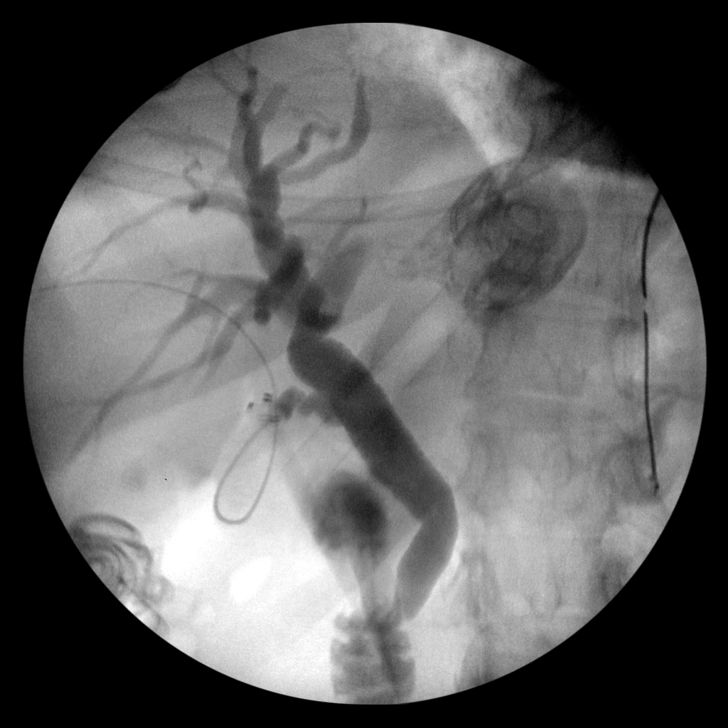
[im 2/2]
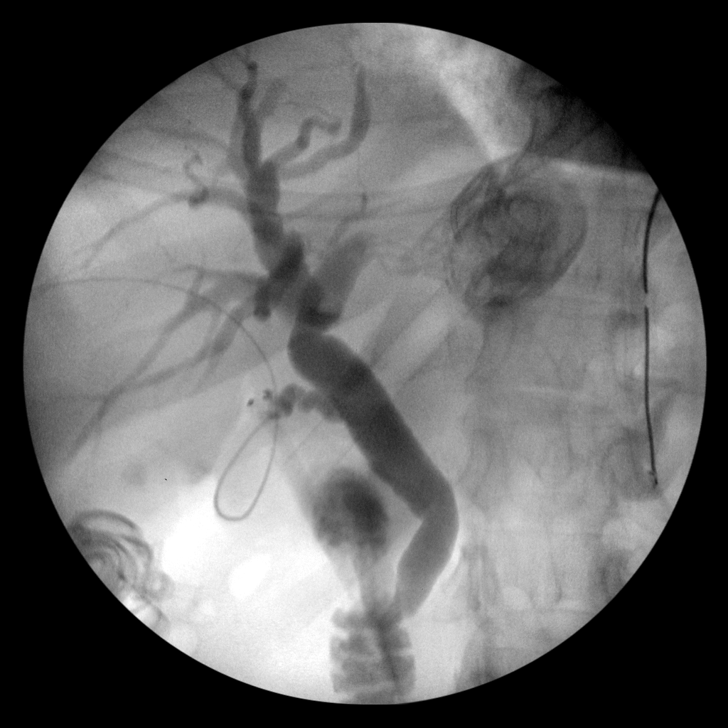
[im 2/2]
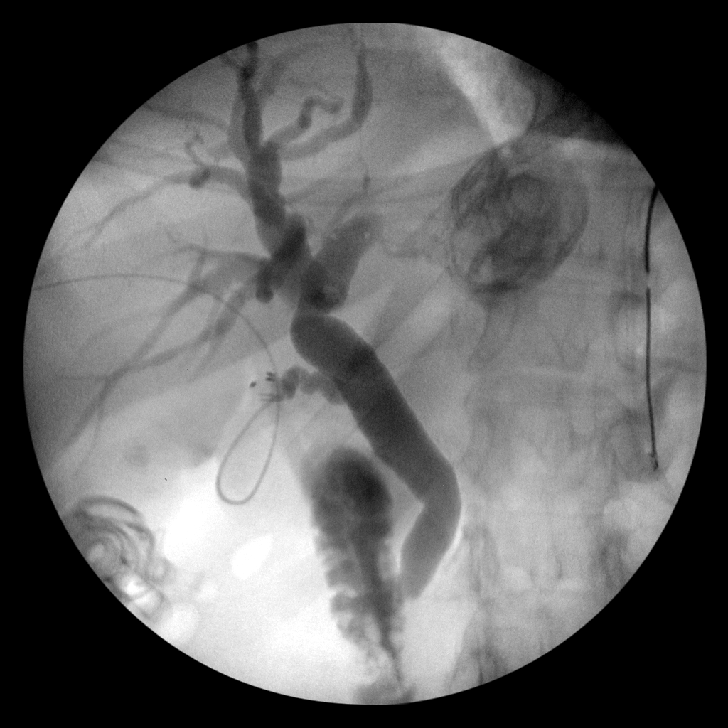
[im 2/2]
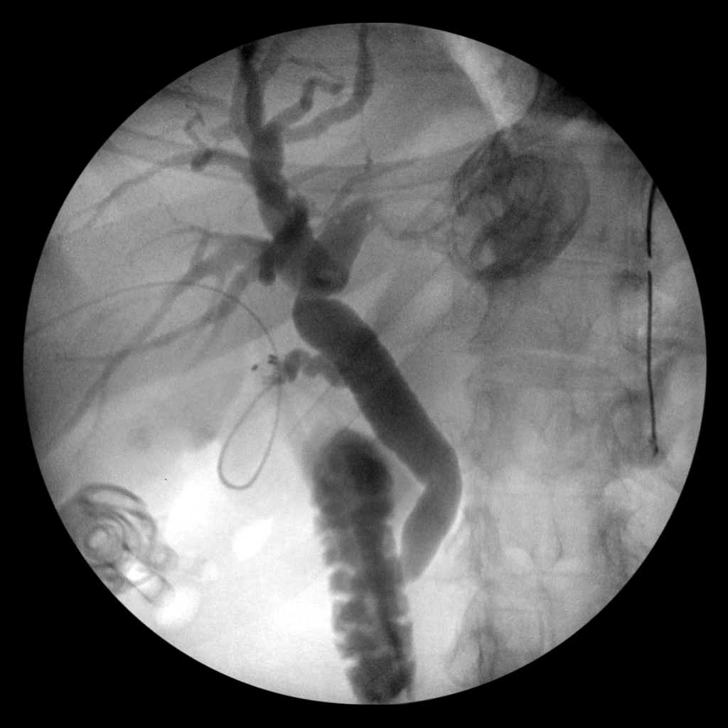

[8 of 8 positions shown; findings below may reference images not displayed]

FINDINGS: Intraoperative cholangiographic images of the right upper abdominal
quadrant during laparoscopic cholecystectomy are provided for
review.

Surgical clips overlie the expected location of the gallbladder
fossa.

Contrast injection demonstrates selective cannulation of the central
aspect of the cystic duct.

There is passage of contrast through the central aspect of the
cystic duct with filling of a moderately dilated common bile duct.
There is passage of contrast though the CBD and into the descending
portion of the duodenum.

There is minimal reflux of injected contrast into the common hepatic
duct and central aspect of the moderately dilated intrahepatic
biliary system.

There are no discrete filling defects within the opacified portions
of the biliary system to suggest the presence of
choledocholithiasis.
IMPRESSION: No evidence of choledocholithiasis.

## 2019-12-05 SURGERY — LAPAROSCOPIC CHOLECYSTECTOMY WITH INTRAOPERATIVE CHOLANGIOGRAM
Anesthesia: General

## 2019-12-05 MED ORDER — BUPIVACAINE-EPINEPHRINE (PF) 0.25% -1:200000 IJ SOLN
INTRAMUSCULAR | Status: AC
  Filled 2019-12-05: qty 30

## 2019-12-05 MED ORDER — LIDOCAINE 2% (20 MG/ML) 5 ML SYRINGE
INTRAMUSCULAR | Status: DC | PRN
  Administered 2019-12-05: 100 mg via INTRAVENOUS

## 2019-12-05 MED ORDER — OXYCODONE HCL 5 MG PO TABS
5.0000 mg | ORAL_TABLET | ORAL | Status: DC | PRN
  Administered 2019-12-06: 5 mg via ORAL
  Filled 2019-12-05: qty 1

## 2019-12-05 MED ORDER — ONDANSETRON HCL 4 MG/2ML IJ SOLN
INTRAMUSCULAR | Status: AC
  Filled 2019-12-05: qty 2

## 2019-12-05 MED ORDER — FENTANYL CITRATE (PF) 250 MCG/5ML IJ SOLN
INTRAMUSCULAR | Status: AC
  Filled 2019-12-05: qty 5

## 2019-12-05 MED ORDER — MIDAZOLAM HCL 2 MG/2ML IJ SOLN
INTRAMUSCULAR | Status: AC
  Filled 2019-12-05: qty 2

## 2019-12-05 MED ORDER — IOPAMIDOL (ISOVUE-300) INJECTION 61%
INTRAVENOUS | Status: DC | PRN
  Administered 2019-12-05: 20 mL

## 2019-12-05 MED ORDER — KETOROLAC TROMETHAMINE 30 MG/ML IJ SOLN
INTRAMUSCULAR | Status: AC
  Filled 2019-12-05: qty 1

## 2019-12-05 MED ORDER — OXYCODONE HCL 5 MG/5ML PO SOLN: 5.0000 mg | Freq: Once | ORAL | Status: DC | PRN

## 2019-12-05 MED ORDER — ONDANSETRON HCL 4 MG/2ML IJ SOLN
4.0000 mg | Freq: Once | INTRAMUSCULAR | Status: AC | PRN
  Administered 2019-12-05: 4 mg via INTRAVENOUS

## 2019-12-05 MED ORDER — DEXAMETHASONE SODIUM PHOSPHATE 10 MG/ML IJ SOLN
INTRAMUSCULAR | Status: AC
  Filled 2019-12-05: qty 1

## 2019-12-05 MED ORDER — OXYCODONE HCL 5 MG PO TABS: 5.0000 mg | ORAL_TABLET | Freq: Once | ORAL | Status: DC | PRN

## 2019-12-05 MED ORDER — DEXAMETHASONE SODIUM PHOSPHATE 10 MG/ML IJ SOLN
INTRAMUSCULAR | Status: DC | PRN
  Administered 2019-12-05: 10 mg via INTRAVENOUS

## 2019-12-05 MED ORDER — ROCURONIUM BROMIDE 10 MG/ML (PF) SYRINGE
PREFILLED_SYRINGE | INTRAVENOUS | Status: AC
  Filled 2019-12-05: qty 10

## 2019-12-05 MED ORDER — LIDOCAINE 2% (20 MG/ML) 5 ML SYRINGE
INTRAMUSCULAR | Status: AC
  Filled 2019-12-05: qty 5

## 2019-12-05 MED ORDER — ROCURONIUM BROMIDE 10 MG/ML (PF) SYRINGE
PREFILLED_SYRINGE | INTRAVENOUS | Status: DC | PRN
  Administered 2019-12-05: 10 mg via INTRAVENOUS
  Administered 2019-12-05: 60 mg via INTRAVENOUS

## 2019-12-05 MED ORDER — PROPOFOL 10 MG/ML IV BOLUS
INTRAVENOUS | Status: DC | PRN
  Administered 2019-12-05: 150 mg via INTRAVENOUS

## 2019-12-05 MED ORDER — MIDAZOLAM HCL 2 MG/2ML IJ SOLN
INTRAMUSCULAR | Status: DC | PRN
  Administered 2019-12-05: 2 mg via INTRAVENOUS

## 2019-12-05 MED ORDER — FENTANYL CITRATE (PF) 250 MCG/5ML IJ SOLN
INTRAMUSCULAR | Status: DC | PRN
  Administered 2019-12-05 (×2): 100 ug via INTRAVENOUS
  Administered 2019-12-05: 50 ug via INTRAVENOUS

## 2019-12-05 MED ORDER — LIDOCAINE HCL (PF) 1 % IJ SOLN
INTRAMUSCULAR | Status: AC
  Filled 2019-12-05: qty 30

## 2019-12-05 MED ORDER — PROPOFOL 10 MG/ML IV BOLUS
INTRAVENOUS | Status: AC
  Filled 2019-12-05: qty 20

## 2019-12-05 MED ORDER — LACTATED RINGERS IR SOLN
Status: DC | PRN
  Administered 2019-12-05: 1000 mL

## 2019-12-05 MED ORDER — HYDROMORPHONE HCL 1 MG/ML IJ SOLN: 0.2500 mg | INTRAMUSCULAR | Status: DC | PRN

## 2019-12-05 MED ORDER — SUGAMMADEX SODIUM 200 MG/2ML IV SOLN
INTRAVENOUS | Status: DC | PRN
  Administered 2019-12-05: 200 mg via INTRAVENOUS

## 2019-12-05 MED ORDER — BUPIVACAINE-EPINEPHRINE (PF) 0.25% -1:200000 IJ SOLN
INTRAMUSCULAR | Status: DC | PRN
  Administered 2019-12-05: 20 mL

## 2019-12-05 MED ORDER — ONDANSETRON HCL 4 MG/2ML IJ SOLN
INTRAMUSCULAR | Status: DC | PRN
  Administered 2019-12-05: 4 mg via INTRAVENOUS

## 2019-12-05 MED ORDER — LACTATED RINGERS IV SOLN: INTRAVENOUS | Status: DC

## 2019-12-05 MED ORDER — ACETAMINOPHEN 325 MG PO TABS
650.0000 mg | ORAL_TABLET | Freq: Four times a day (QID) | ORAL | Status: DC | PRN
  Administered 2019-12-06: 650 mg via ORAL
  Filled 2019-12-05: qty 2

## 2019-12-05 MED ORDER — KETOROLAC TROMETHAMINE 30 MG/ML IJ SOLN
30.0000 mg | Freq: Once | INTRAMUSCULAR | Status: AC | PRN
  Administered 2019-12-05: 30 mg via INTRAVENOUS

## 2019-12-05 SURGICAL SUPPLY — 42 items
APPLIER CLIP ROT 10 11.4 M/L (STAPLE) ×3
CABLE HIGH FREQUENCY MONO STRZ (ELECTRODE) ×3 IMPLANT
CHLORAPREP W/TINT 26 (MISCELLANEOUS) ×3 IMPLANT
CLIP APPLIE ROT 10 11.4 M/L (STAPLE) ×1 IMPLANT
CLIP VESOLOCK MED LG 6/CT (CLIP) IMPLANT
COVER MAYO STAND STRL (DRAPES) IMPLANT
COVER SURGICAL LIGHT HANDLE (MISCELLANEOUS) ×3 IMPLANT
COVER WAND RF STERILE (DRAPES) IMPLANT
DECANTER SPIKE VIAL GLASS SM (MISCELLANEOUS) ×3 IMPLANT
DERMABOND ADVANCED (GAUZE/BANDAGES/DRESSINGS) ×2
DERMABOND ADVANCED .7 DNX12 (GAUZE/BANDAGES/DRESSINGS) IMPLANT
DRAPE C-ARM 42X120 X-RAY (DRAPES) IMPLANT
ELECT L-HOOK LAP 45CM DISP (ELECTROSURGICAL)
ELECT REM PT RETURN 15FT ADLT (MISCELLANEOUS) ×3 IMPLANT
ELECTRODE L-HOOK LAP 45CM DISP (ELECTROSURGICAL) IMPLANT
GLOVE BIO SURGEON STRL SZ 6 (GLOVE) ×3 IMPLANT
GLOVE INDICATOR 6.5 STRL GRN (GLOVE) ×3 IMPLANT
GOWN STRL REUS W/TWL 2XL LVL3 (GOWN DISPOSABLE) ×3 IMPLANT
GOWN STRL REUS W/TWL XL LVL3 (GOWN DISPOSABLE) ×6 IMPLANT
HEMOSTAT SNOW SURGICEL 2X4 (HEMOSTASIS) IMPLANT
KIT BASIN (CUSTOM PROCEDURE TRAY) ×3 IMPLANT
KIT TURNOVER KIT A (KITS) IMPLANT
L-HOOK LAP DISP 36CM (ELECTROSURGICAL) ×3
LHOOK LAP DISP 36CM (ELECTROSURGICAL) IMPLANT
PENCIL SMOKE EVACUATOR (MISCELLANEOUS) IMPLANT
POUCH RETRIEVAL ECOSAC 10 (ENDOMECHANICALS) ×1 IMPLANT
POUCH RETRIEVAL ECOSAC 10MM (ENDOMECHANICALS) ×2
POUCH SPECIMEN RETRIEVAL 10MM (ENDOMECHANICALS) ×3 IMPLANT
PROTECTOR NERVE ULNAR (MISCELLANEOUS) IMPLANT
SCISSORS LAP 5X35 DISP (ENDOMECHANICALS) ×3 IMPLANT
SET CHOLANGIOGRAPH MIX (MISCELLANEOUS) IMPLANT
SET IRRIG TUBING LAPAROSCOPIC (IRRIGATION / IRRIGATOR) ×3 IMPLANT
SET TUBE SMOKE EVAC HIGH FLOW (TUBING) IMPLANT
SLEEVE XCEL OPT CAN 5 100 (ENDOMECHANICALS) ×3 IMPLANT
SUT MNCRL AB 4-0 PS2 18 (SUTURE) ×3 IMPLANT
TAPE CLOTH 4X10 WHT NS (GAUZE/BANDAGES/DRESSINGS) IMPLANT
TOWEL OR 17X26 10 PK STRL BLUE (TOWEL DISPOSABLE) ×3 IMPLANT
TOWEL OR NON WOVEN STRL DISP B (DISPOSABLE) ×3 IMPLANT
TRAY LAPAROSCOPIC (CUSTOM PROCEDURE TRAY) ×3 IMPLANT
TROCAR BLADELESS OPT 5 100 (ENDOMECHANICALS) ×3 IMPLANT
TROCAR XCEL BLUNT TIP 100MML (ENDOMECHANICALS) ×3 IMPLANT
TROCAR XCEL NON-BLD 11X100MML (ENDOMECHANICALS) ×3 IMPLANT

## 2019-12-05 NOTE — Op Note (Signed)
Laparoscopic Cholecystectomy with IOC Procedure Note  Indications: This patient presents with gallstone pancreatitis and will undergo laparoscopic cholecystectomy.  Pre-operative Diagnosis: gallstone pancreatitis  Post-operative Diagnosis: Same and mild acute cholecystitis  Surgeon: [name]   Assistants: Trixie Deis, PA-C  Anesthesia: General endotracheal anesthesia and local  ASA Class: 2  Procedure Details  The patient was seen again in the Holding Room. The risks, benefits, complications, treatment options, and expected outcomes were discussed with the patient. The possibilities of  bleeding, recurrent infection, damage to nearby structures, the need for additional procedures, failure to diagnose a condition, the possible need to convert to an open procedure, and creating a complication requiring transfusion or operation were discussed with the patient. The likelihood of improving the patient's symptoms with return to their baseline status is good.    The patient and/or family concurred with the proposed plan, giving informed consent. The site of surgery properly noted. The patient was taken to Operating Room, and the procedure verified as Laparoscopic Cholecystectomy with Intraoperative Cholangiogram. A Time Out was held and the above information confirmed.  Prior to the induction of general anesthesia, antibiotic prophylaxis was administered. General endotracheal anesthesia was then administered and tolerated well. After the induction, the abdomen was prepped with Chloraprep and draped in the sterile fashion. The patient was positioned in the supine position.  Local anesthetic agent was injected into the skin near the umbilicus and an incision made. We dissected down to the abdominal fascia with blunt dissection.  The fascia was incised vertically and we entered the peritoneal cavity bluntly.  A pursestring suture of 0-Vicryl was placed around the fascial opening.  The Hasson  cannula was inserted and secured with the stay suture.  Pneumoperitoneum was then created with CO2 and tolerated well without any adverse changes in the patient's vital signs. An 11-mm port was placed in the subxiphoid position.  Two 5-mm ports were placed in the right upper quadrant. All skin incisions were infiltrated with a local anesthetic agent before making the incision and placing the trocars.   We positioned the patient in reverse Trendelenburg, tilted slightly to the patient's left.  The gallbladder was identified, the fundus grasped and retracted cephalad. Adhesions were lysed bluntly and with the electrocautery where indicated, taking care not to injure any adjacent organs or viscus. The infundibulum was grasped and retracted laterally, exposing the peritoneum overlying the triangle of Calot. This was then divided and exposed in a blunt fashion.   The cystic duct was clearly identified and bluntly dissected circumferentially. The cystic artery was dissected out as well.  The cystic artery was doubly clipped on the patient side and singly clipped on the specimen side.  The cystic duct was ligated with a clip distally.   An incision was made in the cystic duct and the [facility] cholangiogram catheter introduced. The catheter was secured using a clip. A cholangiogram was then performed, demonstrating diffusely dilated biliary tree.  The cystic duct was then ligated with clips and divided.  The gallbladder was dissected from the liver bed in retrograde fashion with the electrocautery. The gallbladder was removed and placed in an Endocatch bag.  The gallbladder and Endocatch bag were then removed through the umbilical port site.  The liver bed was irrigated and inspected. Hemostasis was achieved with the electrocautery. Copious irrigation was utilized and was repeatedly aspirated until clear.  Several stones were spilled and these were removed with the large sucker.    We again inspected the right upper  quadrant  for hemostasis.  Four quadrant inspection showed no gross pathology.  Pneumoperitoneum was released as we removed the trocars.   The pursestring suture was used to close the umbilical fascia. Additional fascial defect was closed with two interrupted 0-0 vicryl suture.   4-0 Monocryl was used to close the skin.   The skin was cleaned and dry, and Dermabond was applied. The patient was then extubated and brought to the recovery room in stable condition. Instrument, sponge, and needle counts were correct at closure and at the conclusion of the case.   Findings: Friable gallbladder.  Diffusely dilated bile ducts, but no evidence of filling defect and no significant stricture  Estimated Blood Loss:  min         Drains: none.            Specimens: Gallbladder to pathology       Complications: None; patient tolerated the procedure well.         Disposition: PACU - hemodynamically stable.         Condition: stable

## 2019-12-05 NOTE — Anesthesia Postprocedure Evaluation (Signed)
Anesthesia Post Note  Patient: Audrey Bennett  Procedure(s) Performed: LAPAROSCOPIC CHOLECYSTECTOMY WITH INTRAOPERATIVE CHOLANGIOGRAM (N/A )     Patient location during evaluation: PACU Anesthesia Type: General Level of consciousness: awake and alert Pain management: pain level controlled Vital Signs Assessment: post-procedure vital signs reviewed and stable Respiratory status: spontaneous breathing, nonlabored ventilation, respiratory function stable and patient connected to nasal cannula oxygen Cardiovascular status: blood pressure returned to baseline and stable Postop Assessment: no apparent nausea or vomiting Anesthetic complications: no   No complications documented.  Last Vitals:  Vitals:   12/05/19 1815 12/05/19 1841  BP: (!) 158/77 (!) 162/71  Pulse: 78 78  Resp: 18 18  Temp: 36.6 C 36.7 C  SpO2: 92% 95%    Last Pain:  Vitals:   12/05/19 1841  TempSrc: Oral  PainSc:                  Trevor Iha

## 2019-12-05 NOTE — Anesthesia Preprocedure Evaluation (Addendum)
Anesthesia Evaluation  Patient identified by MRN, date of birth, ID band Patient awake    Reviewed: Allergy & Precautions, NPO status , Patient's Chart, lab work & pertinent test results  Airway Mallampati: I  TM Distance: >3 FB Neck ROM: Full    Dental no notable dental hx. (+) Teeth Intact, Dental Advisory Given   Pulmonary neg pulmonary ROS,    Pulmonary exam normal breath sounds clear to auscultation       Cardiovascular hypertension, Pt. on medications Normal cardiovascular exam+ dysrhythmias Supra Ventricular Tachycardia  Rhythm:Regular Rate:Normal     Neuro/Psych negative neurological ROS  negative psych ROS   GI/Hepatic negative GI ROS, Neg liver ROS,   Endo/Other  negative endocrine ROS  Renal/GU negative Renal ROS     Musculoskeletal negative musculoskeletal ROS (+)   Abdominal   Peds  Hematology negative hematology ROS (+) Lab Results      Component                Value               Date                      WBC                      5.7                 12/05/2019                HGB                      13.3                12/05/2019                HCT                      42.3                12/05/2019                MCV                      85.6                12/05/2019                PLT                      308                 12/05/2019              Anesthesia Other Findings   Reproductive/Obstetrics                            Anesthesia Physical Anesthesia Plan  ASA: II  Anesthesia Plan: General   Post-op Pain Management:    Induction: Intravenous  PONV Risk Score and Plan: Treatment may vary due to age or medical condition  Airway Management Planned: Nasal Cannula and Natural Airway  Additional Equipment: None  Intra-op Plan:   Post-operative Plan:   Informed Consent: I have reviewed the patients History and Physical, chart, labs and discussed the  procedure including the risks, benefits and alternatives for the proposed anesthesia with the  patient or authorized representative who has indicated his/her understanding and acceptance.     Dental advisory given  Plan Discussed with:   Anesthesia Plan Comments:        Anesthesia Quick Evaluation

## 2019-12-05 NOTE — Consult Note (Signed)
Referring Provider: Sherrie George, PA-C Primary Care Physician:  Regino Bellow, MD Primary Gastroenterologist: Gentry Fitz  Reason for Consultation:  Gallstone pancreatitis, abnormal MRCP  HPI: Audrey Bennett is a 64 y.o. female with history of hypertension presenting for consultation of abnormal MRCP.  Patient presented to the ED on Monday 7/4 with acute abdominal pain, nausea, and vomiting.  She was found to have gallstone pancreatitis.  Since that time, she has been improving, and denies any abdominal pain, nausea, or vomiting today.  Reports feeling "sluggish" today but denies any other complaints.  Recently, she has had less of an appetite but denies any unexplained weight loss.  Denies any recent yellowing of the skin or eyes.  She last had a bowel movement today, which was soft.  She denies any recent melena or hematochezia.  Depending on what she eats, she intermittently has diarrhea, but she reports this is chronic.  Denies any dysphagia, GERD, or new changes in bowel habits.  Denies any smoking/tobacco or alcohol use.  Occasionally takes ibuprofen for arthritis pain but does not take it on a regular basis.  Denies any aspirin or blood thinner use.  Her father has a history of colon cancer, and her brother is deceased from colon cancer.  She denies any family history of liver or gallbladder cancers.  Per chart review, it appears her last colonoscopy was 07/2011, but the report is unavailable through care everywhere.  Past Medical History:  Diagnosis Date   Hypertension    SVT (supraventricular tachycardia) (HCC)     Past Surgical History:  Procedure Laterality Date   BREAST SURGERY      Prior to Admission medications   Medication Sig Start Date End Date Taking? Authorizing Provider  diltiazem (DILACOR XR) 240 MG 24 hr capsule Take 240 mg by mouth daily.   Yes [provider]  hydrochlorothiazide (MICROZIDE) 12.5 MG capsule Take 12.5 mg by mouth daily.   Yes  [provider]  ibuprofen (ADVIL) 800 MG tablet Take 800 mg by mouth every 8 (eight) hours as needed for headache, mild pain, moderate pain or cramping.   Yes [provider]  oseltamivir (TAMIFLU) 75 MG capsule Take 1 capsule (75 mg total) by mouth every 12 (twelve) hours. Patient not taking: Reported on 12/03/2019 05/28/17   Dartha Lodge, PA-C    Scheduled Meds:  diltiazem  240 mg Oral Daily   hydrochlorothiazide  12.5 mg Oral Daily   pantoprazole (PROTONIX) IV  40 mg Intravenous QHS   Continuous Infusions:  cefTRIAXone (ROCEPHIN)  IV 2 g (12/04/19 1114)   dextrose 5 % and 0.9 % NaCl with KCl 20 mEq/L 100 mL/hr at 12/04/19 2311   PRN Meds:.HYDROmorphone (DILAUDID) injection, morphine injection, ondansetron **OR** ondansetron (ZOFRAN) IV  Allergies as of 12/02/2019 - Review Complete 12/02/2019  Allergen Reaction Noted   Ace inhibitors  12/06/2014    No family history on file.  Social History   Socioeconomic History   Marital status: Single    Spouse name: Not on file   Number of children: Not on file   Years of education: Not on file   Highest education level: Not on file  Occupational History   Not on file  Tobacco Use   Smoking status: Never Smoker   Smokeless tobacco: Never Used  Substance and Sexual Activity   Alcohol use: No   Drug use: No   Sexual activity: Never    Birth control/protection: None  Other Topics Concern   Not on  file  Social History Narrative   Not on file   Social Determinants of Health   Financial Resource Strain:    Difficulty of Paying Living Expenses:   Food Insecurity:    Worried About Programme researcher, broadcasting/film/video in the Last Year:    Barista in the Last Year:   Transportation Needs:    Freight forwarder (Medical):    Lack of Transportation (Non-Medical):   Physical Activity:    Days of Exercise per Week:    Minutes of Exercise per Session:   Stress:    Feeling of Stress :    Social Connections:    Frequency of Communication with Friends and Family:    Frequency of Social Gatherings with Friends and Family:    Attends Religious Services:    Active Member of Clubs or Organizations:    Attends Engineer, structural:    Marital Status:   Intimate Partner Violence:    Fear of Current or Ex-Partner:    Emotionally Abused:    Physically Abused:    Sexually Abused:     Review of Systems: Review of Systems  Constitutional: Positive for malaise/fatigue. Negative for chills, fever and weight loss.  HENT: Negative for hearing loss and tinnitus.   Eyes: Negative for pain and redness.  Respiratory: Negative for cough and shortness of breath.   Cardiovascular: Negative for chest pain and palpitations.  Gastrointestinal: Negative for abdominal pain, blood in stool, constipation, diarrhea, heartburn, melena, nausea and vomiting.  Genitourinary: Negative for flank pain and hematuria.  Musculoskeletal: Positive for joint pain. Negative for falls.  Skin: Negative for itching and rash.  Neurological: Negative for dizziness and seizures.  Endo/Heme/Allergies: Negative for polydipsia. Does not bruise/bleed easily.  Psychiatric/Behavioral: Negative for substance abuse. The patient is not nervous/anxious.     Physical Exam: Vital signs: Vitals:   12/04/19 2149 12/05/19 0538  BP: (!) 155/70 (!) 151/77  Pulse: 76 70  Resp: 18 20  Temp: 98.7 F (37.1 C) 98.7 F (37.1 C)  SpO2: 91% 97%   Last BM Date: 12/02/19 Physical Exam Constitutional:      General: She is not in acute distress.    Appearance: Normal appearance.  HENT:     Head: Normocephalic and atraumatic.     Nose: Nose normal.     Mouth/Throat:     Mouth: Mucous membranes are moist.     Pharynx: Oropharynx is clear.  Eyes:     General: No scleral icterus.    Extraocular Movements: Extraocular movements intact.     Conjunctiva/sclera: Conjunctivae normal.  Cardiovascular:     Rate  and Rhythm: Normal rate and regular rhythm.     Pulses: Normal pulses.     Heart sounds: Normal heart sounds.  Pulmonary:     Effort: Pulmonary effort is normal. No respiratory distress.     Breath sounds: Normal breath sounds.  Abdominal:     General: Bowel sounds are normal. There is no distension.     Palpations: Abdomen is soft. There is no mass.     Tenderness: There is no abdominal tenderness. There is no guarding or rebound.     Hernia: No hernia is present.  Musculoskeletal:        General: No swelling or tenderness.     Cervical back: Normal range of motion and neck supple.  Skin:    General: Skin is warm and dry.  Neurological:     General: No focal deficit present.  Mental Status: She is alert and oriented to person, place, and time.  Psychiatric:        Mood and Affect: Mood normal.        Behavior: Behavior normal.     GI:  Lab Results: Recent Labs    12/02/19 2141 12/04/19 1229 12/05/19 0247  WBC 8.3 5.7 5.7  HGB 13.9 13.5 13.3  HCT 44.3 42.6 42.3  PLT 334 274 308   BMET Recent Labs    12/02/19 2141 12/04/19 1229  NA 137 143  K 3.8 3.2*  CL 101 104  CO2 23 30  GLUCOSE 177* 126*  BUN 13 9  CREATININE 0.70 0.66  CALCIUM 8.6* 8.3*   LFT Recent Labs    12/04/19 1229  PROT 7.9  ALBUMIN 2.9*  AST 82*  ALT 79*  ALKPHOS 184*  BILITOT 3.0*   PT/INR No results for input(s): LABPROT, INR in the last 72 hours.   Studies/Results: MR 3D Recon At Scanner  Result Date: 12/04/2019 CLINICAL DATA:  Biliary dilatation with CT findings suspicious for cholecystitis, query choledocholithiasis. EXAM: MRI ABDOMEN WITHOUT AND WITH CONTRAST (INCLUDING MRCP) TECHNIQUE: Multiplanar multisequence MR imaging of the abdomen was performed both before and after the administration of intravenous contrast. Heavily T2-weighted images of the biliary and pancreatic ducts were obtained, and three-dimensional MRCP images were rendered by post processing. CONTRAST:  10mL  GADAVIST GADOBUTROL 1 MMOL/ML IV SOLN COMPARISON:  12/02/2019 CT FINDINGS: Despite efforts by the technologist and patient, motion artifact is present on today's exam and could not be eliminated. This reduces exam sensitivity and specificity. Lower chest: Trace bilateral pleural effusions. Borderline cardiomegaly. Hepatobiliary: There are 2 larger gallstones measuring up to 2.5 cm in diameter in addition to debris or smaller gallstone subtle dependently in the gallbladder. Mildly abnormal gallbladder wall thickening along with moderate distention of the gallbladder noted. There scattered nonenhancing fluid signal hepatic lesions compatible with cysts. Some of the hepatic lesions demonstrate mild rim enhancement. At least one of the lesions in segment 7 of the liver appears to have thicker enhancing margins, measuring about 1.6 by 1.2 cm on image 11/28, and is technically nonspecific. No well-defined filling defect although there is some adenopathy in the porta hepatis which may be extrinsically contributing to the narrowing of the common hepatic duct. I not see a well-defined cholangiocarcinoma. Presumably given the relative patency of the common hepatic duct and larger caliber on recent CT, this may represent some form of spasm or inflammation of the common hepatic duct. There is abnormal narrowing of the common hepatic duct. The right bile duct is dilated but the left hepatic duct is nondilated. This dilation extends into a region of narrowing of the biliary tree in the vicinity of the confluence and common hepatic duct which appears to be new compared to the CT examination of 10:52 p.m. Pancreas:  Unremarkable Spleen:  Unremarkable Adrenals/Urinary Tract:  Unremarkable Stomach/Bowel: Unremarkable Vascular/Lymphatic: Aortoiliac atherosclerotic vascular disease. Portacaval node 1.4 cm in short axis. Porta hepatis/peripancreatic node 1.3 cm in short axis, image 21/3. Left retrocrural node 0.8 cm in short axis, image  38/20. Other: Nonspecific upper omental nodule or lymph node measuring 0.8 cm in long axis image 22/3. Musculoskeletal: Moderate lumbar spondylosis and degenerative disc disease. IMPRESSION: 1. Intrahepatic dilatation with borderline dilation of the common bile, along duct with abnormal narrowing of the common hepatic duct, and of the central right bile duct. This narrowing may represent some form of spasm or inflammation of the common hepatic  duct, as the narrowing represents a change from the CT from 12/02/2019. I not see a well-defined cholangiocarcinoma, although there are some limitations of today's exam due to motion artifact and other factors. There is some adenopathy in the porta hepatis and retroperitoneum, which may be extrinsically contributing to the narrowing of the common hepatic duct. No well-defined filling defect within the lumen of the extrahepatic biliary tree. 2. Cholelithiasis with abnormal gallbladder wall thickening and moderate distention. Acute cholecystitis is a distinct possibility. 3. Trace bilateral pleural effusions. 4. Moderate lumbar spondylosis and degenerative disc disease. 5. Scattered liver lesions, some of which are cystic. Several have thin enhancing margins and may represent biliary hamartomas. A lesion in segment 7 has a thicker marginal enhancement on all series and is technically nonspecific. Biliary cystadenoma not excluded. Micro abscesses or cystic liver metastatic disease not totally excluded given this unusual enhancement appearance 6. Despite efforts by the technologist and patient, motion artifact is present on today's exam and could not be eliminated. This reduces exam sensitivity and specificity. Electronically Signed   By: Gaylyn Rong M.D.   On: 12/04/2019 05:29   MR ABDOMEN MRCP W WO CONTAST  Result Date: 12/04/2019 CLINICAL DATA:  Biliary dilatation with CT findings suspicious for cholecystitis, query choledocholithiasis. EXAM: MRI ABDOMEN WITHOUT AND  WITH CONTRAST (INCLUDING MRCP) TECHNIQUE: Multiplanar multisequence MR imaging of the abdomen was performed both before and after the administration of intravenous contrast. Heavily T2-weighted images of the biliary and pancreatic ducts were obtained, and three-dimensional MRCP images were rendered by post processing. CONTRAST:  69mL GADAVIST GADOBUTROL 1 MMOL/ML IV SOLN COMPARISON:  12/02/2019 CT FINDINGS: Despite efforts by the technologist and patient, motion artifact is present on today's exam and could not be eliminated. This reduces exam sensitivity and specificity. Lower chest: Trace bilateral pleural effusions. Borderline cardiomegaly. Hepatobiliary: There are 2 larger gallstones measuring up to 2.5 cm in diameter in addition to debris or smaller gallstone subtle dependently in the gallbladder. Mildly abnormal gallbladder wall thickening along with moderate distention of the gallbladder noted. There scattered nonenhancing fluid signal hepatic lesions compatible with cysts. Some of the hepatic lesions demonstrate mild rim enhancement. At least one of the lesions in segment 7 of the liver appears to have thicker enhancing margins, measuring about 1.6 by 1.2 cm on image 11/28, and is technically nonspecific. No well-defined filling defect although there is some adenopathy in the porta hepatis which may be extrinsically contributing to the narrowing of the common hepatic duct. I not see a well-defined cholangiocarcinoma. Presumably given the relative patency of the common hepatic duct and larger caliber on recent CT, this may represent some form of spasm or inflammation of the common hepatic duct. There is abnormal narrowing of the common hepatic duct. The right bile duct is dilated but the left hepatic duct is nondilated. This dilation extends into a region of narrowing of the biliary tree in the vicinity of the confluence and common hepatic duct which appears to be new compared to the CT examination of 10:52  p.m. Pancreas:  Unremarkable Spleen:  Unremarkable Adrenals/Urinary Tract:  Unremarkable Stomach/Bowel: Unremarkable Vascular/Lymphatic: Aortoiliac atherosclerotic vascular disease. Portacaval node 1.4 cm in short axis. Porta hepatis/peripancreatic node 1.3 cm in short axis, image 21/3. Left retrocrural node 0.8 cm in short axis, image 38/20. Other: Nonspecific upper omental nodule or lymph node measuring 0.8 cm in long axis image 22/3. Musculoskeletal: Moderate lumbar spondylosis and degenerative disc disease. IMPRESSION: 1. Intrahepatic dilatation with borderline dilation of the common bile,  along duct with abnormal narrowing of the common hepatic duct, and of the central right bile duct. This narrowing may represent some form of spasm or inflammation of the common hepatic duct, as the narrowing represents a change from the CT from 12/02/2019. I not see a well-defined cholangiocarcinoma, although there are some limitations of today's exam due to motion artifact and other factors. There is some adenopathy in the porta hepatis and retroperitoneum, which may be extrinsically contributing to the narrowing of the common hepatic duct. No well-defined filling defect within the lumen of the extrahepatic biliary tree. 2. Cholelithiasis with abnormal gallbladder wall thickening and moderate distention. Acute cholecystitis is a distinct possibility. 3. Trace bilateral pleural effusions. 4. Moderate lumbar spondylosis and degenerative disc disease. 5. Scattered liver lesions, some of which are cystic. Several have thin enhancing margins and may represent biliary hamartomas. A lesion in segment 7 has a thicker marginal enhancement on all series and is technically nonspecific. Biliary cystadenoma not excluded. Micro abscesses or cystic liver metastatic disease not totally excluded given this unusual enhancement appearance 6. Despite efforts by the technologist and patient, motion artifact is present on today's exam and could not  be eliminated. This reduces exam sensitivity and specificity. Electronically Signed   By: Gaylyn Rong M.D.   On: 12/04/2019 05:29   Impression: Gallstone pancreatitis -Lipase has normalized, 28 as of 7/6 -T. Bili 3.0/ AST 82/ ALT 79/ ALP 184 on 7/6  Abnormal MRCP: MRCP 12/06/19 showed intrahepatic dilatation with borderline dilation of the common bile, along duct with abnormal narrowing of the common hepatic duct, and of the central right bile duct. This narrowing may represent some form of spasm or inflammation of the common hepatic duct, as the narrowing represents a change from the CT from 12/02/2019. No well-defined cholangiocarcinoma, although there are some limitations due to motion artifact.  There is some adenopathy in the porta hepatis and retroperitoneum, which may be extrinsically contributing to the narrowing of the common hepatic duct. No well-defined filling defect within the lumen of the extrahepatic biliary tree. -Ca 19-9 elevated to 161, though this is non-specific  Family history of colon cancer: brother, deceased, and father -Last colonoscopy 2013; report unavailable per [facility]  Plan: Agree with laparoscopic cholecystectomy today.  Case discussed with Dr. Marca Ancona.  Unlikely this is a cholangiocarcinoma, and ERCP with brushings would be low yield.  No red flags such as weight loss, jaundice, or family history of cholangiocarcinoma.  Because of this, recommend outpatient EUS to further evaluate narrowing of common hepatic duct.  Patient has a family history of colon cancer in both her brother and father.  She has not had a colonoscopy since 2013.  Recommend outpatient colonoscopy.  It is possible that this could be completed at time of the EUS.  [facility] will sign off. Please contact us if we can be of any further assistance during this hospital stay.   LOS: 2 days   Edrick Kins  PA-C 12/05/2019, 10:09 AM  Contact #  [phone]

## 2019-12-05 NOTE — Plan of Care (Signed)

## 2019-12-05 NOTE — Interval H&P Note (Signed)
No changes.    Questions answered.    History and Physical Interval Note:  12/05/2019 2:53 PM  Audrey Bennett  has presented today for surgery, with the diagnosis of GALLSTONE PANCREATITIS.  The various methods of treatment have been discussed with the patient and family. After consideration of risks, benefits and other options for treatment, the patient has consented to  Procedure(s): LAPAROSCOPIC CHOLECYSTECTOMY WITH INTRAOPERATIVE CHOLANGIOGRAM (N/A) as a surgical intervention.  The patient's history has been reviewed, patient examined, no change in status, stable for surgery.  I have reviewed the patient's chart and labs.  Questions were answered to the patient's satisfaction.     [name]

## 2019-12-05 NOTE — Anesthesia Procedure Notes (Signed)
Procedure Name: Intubation Date/Time: 12/05/2019 3:15 PM Performed by: Florene Route, CRNA Patient Re-evaluated:Patient Re-evaluated prior to induction Oxygen Delivery Method: Circle system utilized Preoxygenation: Pre-oxygenation with 100% oxygen Induction Type: IV induction Ventilation: Mask ventilation without difficulty and Oral airway inserted - appropriate to patient size Laryngoscope Size: Hyacinth Meeker and 2 Grade View: Grade I Tube type: Oral Tube size: 7.5 mm Number of attempts: 1 Airway Equipment and Method: Stylet Placement Confirmation: ETT inserted through vocal cords under direct vision,  positive ETCO2 and breath sounds checked- equal and bilateral Secured at: 21 cm Tube secured with: Tape Dental Injury: Teeth and Oropharynx as per pre-operative assessment

## 2019-12-05 NOTE — Transfer of Care (Signed)
Immediate Anesthesia Transfer of Care Note  Patient: Audrey Bennett  Procedure(s) Performed: LAPAROSCOPIC CHOLECYSTECTOMY WITH INTRAOPERATIVE CHOLANGIOGRAM (N/A )  Patient Location: PACU  Anesthesia Type:General  Level of Consciousness: awake, alert , oriented and patient cooperative  Airway & Oxygen Therapy: Patient Spontanous Breathing and Patient connected to face mask  Post-op Assessment: Report given to RN and Post -op Vital signs reviewed and stable  Post vital signs: Reviewed and stable  Last Vitals:  Vitals Value Taken Time  BP 159/78 12/05/19 1652  Temp    Pulse 83 12/05/19 1657  Resp    SpO2 90 % 12/05/19 1657  Vitals shown include unvalidated device data.  Last Pain:  Vitals:   12/05/19 1414  TempSrc: Oral  PainSc:       Patients Stated Pain Goal: 3 (12/03/19 1500)  Complications: No complications documented.

## 2019-12-06 ENCOUNTER — Encounter (HOSPITAL_COMMUNITY): Payer: Self-pay | Admitting: General Surgery

## 2019-12-06 LAB — COMPREHENSIVE METABOLIC PANEL
ALT: 61 U/L — ABNORMAL HIGH (ref 0–44)
AST: 60 U/L — ABNORMAL HIGH (ref 15–41)
Albumin: 2.7 g/dL — ABNORMAL LOW (ref 3.5–5.0)
Alkaline Phosphatase: 148 U/L — ABNORMAL HIGH (ref 38–126)
Anion gap: 8 (ref 5–15)
BUN: 9 mg/dL (ref 8–23)
CO2: 28 mmol/L (ref 22–32)
Calcium: 8.3 mg/dL — ABNORMAL LOW (ref 8.9–10.3)
Chloride: 105 mmol/L (ref 98–111)
Creatinine, Ser: 0.56 mg/dL (ref 0.44–1.00)
GFR calc Af Amer: >60 mL/min (ref >60)
GFR calc non Af Amer: >60 mL/min (ref >60)
Glucose, Bld: 233 mg/dL — ABNORMAL HIGH (ref 70–99)
Potassium: 4.1 mmol/L (ref 3.5–5.1)
Sodium: 141 mmol/L (ref 135–145)
Total Bilirubin: 0.8 mg/dL (ref 0.3–1.2)
Total Protein: 7.6 g/dL (ref 6.5–8.1)

## 2019-12-06 LAB — CBC
HCT: 41.9 % (ref 36.0–46.0)
Hemoglobin: 13.1 g/dL (ref 12.0–15.0)
MCH: 26.7 pg (ref 26.0–34.0)
MCHC: 31.3 g/dL (ref 30.0–36.0)
MCV: 85.3 fL (ref 80.0–100.0)
Platelets: 302 10*3/uL (ref 150–400)
RBC: 4.91 MIL/uL (ref 3.87–5.11)
RDW: 14.6 % (ref 11.5–15.5)
WBC: 8.7 10*3/uL (ref 4.0–10.5)
nRBC: 0 % (ref 0.0–0.2)

## 2019-12-06 MED ORDER — OXYCODONE HCL 5 MG PO TABS: 5.0000 mg | ORAL_TABLET | Freq: Four times a day (QID) | ORAL | 0 refills | Status: AC | PRN

## 2019-12-06 MED ORDER — ACETAMINOPHEN 325 MG PO TABS: 650.0000 mg | ORAL_TABLET | Freq: Four times a day (QID) | ORAL | Status: AC | PRN

## 2019-12-06 NOTE — Plan of Care (Signed)
Plan of care reviewed and discussed with the patient. 

## 2019-12-06 NOTE — Discharge Summary (Signed)
[facility] Discharge Summary   Patient ID: Audrey Bennett MRN: [id number] DOB/AGE: 1955/07/18 64 y.o.  Admit date: 12/02/2019 Discharge date: 12/06/2019  Admitting Diagnosis: Gallstone pancreatitis   Discharge Diagnosis Gallstone pancreatitis Elevated CA 19-9  Consultants Gastroenterology   Imaging: DG Cholangiogram Operative  Result Date: 12/05/2019 CLINICAL DATA:  Intraoperative cholangiogram during laparoscopic cholecystectomy. EXAM: INTRAOPERATIVE CHOLANGIOGRAM FLUOROSCOPY TIME:  25 seconds COMPARISON:  MRCP-12/03/2019 FINDINGS: Intraoperative cholangiographic images of the right upper abdominal quadrant during laparoscopic cholecystectomy are provided for review. Surgical clips overlie the expected location of the gallbladder fossa. Contrast injection demonstrates selective cannulation of the central aspect of the cystic duct. There is passage of contrast through the central aspect of the cystic duct with filling of a moderately dilated common bile duct. There is passage of contrast though the CBD and into the descending portion of the duodenum. There is minimal reflux of injected contrast into the common hepatic duct and central aspect of the moderately dilated intrahepatic biliary system. There are no discrete filling defects within the opacified portions of the biliary system to suggest the presence of choledocholithiasis. IMPRESSION: No evidence of choledocholithiasis. Electronically Signed   By: Simonne Come M.D.   On: 12/05/2019 16:32    Procedures Dr. Donell Beers (12/05/19) - Laparoscopic Cholecystectomy with [facility]  Hospital Course:  Patient is a 64 year old female who presented to [facility] with abdominal pain.  Workup showed biliary pancreatitis.  Patient was admitted and MRCP ordered. MRCP was slightly abnormal and CA 19-9 slightly elevated. GI consulted, recommended proceeding with laparoscopic cholecystectomy. Patient underwent procedure listed above. Tolerated procedure well  and was transferred to the floor.  Diet was advanced as tolerated.  On POD#1, the patient was voiding well, tolerating diet, ambulating well, pain well controlled, vital signs stable, incisions c/d/i and felt stable for discharge home.  Patient will follow up in our office in 3 weeks and knows to call with questions or concerns. She will call to confirm appointment date/time.    Physical Exam: General:  Alert, NAD, pleasant, comfortable Abd:  Soft, ND, mild tenderness, incisions C/D/I   I or a member of my team have reviewed this patient in the Controlled Substance Database.   Allergies as of 12/06/2019      Reactions   Ace Inhibitors    Lisinopril      Medication List    STOP taking these medications   oseltamivir 75 MG capsule Commonly known as: TAMIFLU     TAKE these medications   acetaminophen 325 MG tablet Commonly known as: TYLENOL Take 2 tablets (650 mg total) by mouth every 6 (six) hours as needed for mild pain or fever.   diltiazem 240 MG 24 hr capsule Commonly known as: DILACOR XR Take 240 mg by mouth daily.   hydrochlorothiazide 12.5 MG capsule Commonly known as: MICROZIDE Take 12.5 mg by mouth daily.   ibuprofen 800 MG tablet Commonly known as: ADVIL Take 800 mg by mouth every 8 (eight) hours as needed for headache, mild pain, moderate pain or cramping.   oxyCODONE 5 MG immediate release tablet Commonly known as: Oxy IR/ROXICODONE Take 1-2 tablets (5-10 mg total) by mouth every 6 (six) hours as needed for moderate pain or severe pain.         Follow-up Information    Sur[facility], [facility].   Specialty: General Surgery Why: Follow up in about 3 weeks, office should call you with appointment date/time. Please arrive 30 min prior to appointment time. Bring  photo ID and insurance information.  Contact information: [address] [place] [address] [phone]        Gastroenterology, [facility]. Call.   Why: Call to schedule  colonoscopy Contact information: [address] [place] [address] [phone]        Regino Bellow, MD Follow up.   Specialty: Family Medicine Why: Follow up as needed or scheduled Contact information: [address] [place] [address] [phone]               Signed: Wells Guiles, [facility] Surgery 12/06/2019, 8:04 AM Please see Amion for pager number during day hours 7:00am-4:30pm

## 2019-12-06 NOTE — Plan of Care (Signed)
Patient discharged home in stable condition, waiting on her ride 

## 2019-12-06 NOTE — Discharge Instructions (Signed)
[facility] LAPAROSCOPIC SURGERY: POST OP INSTRUCTIONS Always review your discharge instruction sheet given to you by the facility where your surgery was performed. IF YOU HAVE DISABILITY OR FAMILY LEAVE FORMS, YOU MUST BRING THEM TO THE OFFICE FOR PROCESSING.   DO NOT GIVE THEM TO YOUR DOCTOR.  PAIN CONTROL  1. First take acetaminophen (Tylenol) AND/or ibuprofen (Advil) to control your pain after surgery.  Follow directions on package.  Taking acetaminophen (Tylenol) and/or ibuprofen (Advil) regularly after surgery will help to control your pain and lower the amount of prescription pain medication you may need.  You should not take more than 3,000 mg (3 grams) of acetaminophen (Tylenol) in 24 hours.  You should not take ibuprofen (Advil), aleve, motrin, naprosyn or other NSAIDS if you have a history of stomach ulcers or chronic kidney disease.  2. A prescription for pain medication may be given to you upon discharge.  Take your pain medication as prescribed, if you still have uncontrolled pain after taking acetaminophen (Tylenol) or ibuprofen (Advil). 3. Use ice packs to help control pain. 4. If you need a refill on your pain medication, please contact your pharmacy.  They will contact our office to request authorization. Prescriptions will not be filled after 5pm or on week-ends.  HOME MEDICATIONS 5. Take your usually prescribed medications unless otherwise directed.  DIET 6. You should follow a light diet the first few days after arrival home.  Be sure to include lots of fluids daily. Avoid fatty, fried foods.   CONSTIPATION 7. It is common to experience some constipation after surgery and if you are taking pain medication.  Increasing fluid intake and taking a stool softener (such as Colace) will usually help or prevent this problem from occurring.  A mild laxative (Milk of Magnesia or Miralax) should be taken according to package instructions if there are no bowel  movements after 48 hours.  WOUND/INCISION CARE 8. Most patients will experience some swelling and bruising in the area of the incisions.  Ice packs will help.  Swelling and bruising can take several days to resolve.  9. Unless discharge instructions indicate otherwise, follow guidelines below  a. STERI-STRIPS - you may remove your outer bandages 48 hours after surgery, and you may shower at that time.  You have steri-strips (small skin tapes) in place directly over the incision.  These strips should be left on the skin for 7-10 days.   b. DERMABOND/SKIN GLUE - you may shower in 24 hours.  The glue will flake off over the next 2-3 weeks. 10. Any sutures or staples will be removed at the office during your follow-up visit.  ACTIVITIES 11. You may resume regular (light) daily activities beginning the next day--such as daily self-care, walking, climbing stairs--gradually increasing activities as tolerated.  You may have sexual intercourse when it is comfortable.  Refrain from any heavy lifting or straining until approved by your doctor. a. You may drive when you are no longer taking prescription pain medication, you can comfortably wear a seatbelt, and you can safely maneuver your car and apply brakes.  FOLLOW-UP 12. You should see your doctor in the office for a follow-up appointment approximately 2-3 weeks after your surgery.  You should have been given your post-op/follow-up appointment when your surgery was scheduled.  If you did not receive a post-op/follow-up appointment, make sure that you call for this appointment within a day or two after you arrive home to insure a convenient appointment time.     WHEN TO CALL YOUR DOCTOR: 1. Fever over 101.0 2. Inability to urinate 3. Continued bleeding from incision. 4. Increased pain, redness, or drainage from the incision. 5. Increasing abdominal pain  The clinic staff is available to answer your questions during regular business hours.  Please don't  hesitate to call and ask to speak to one of the nurses for clinical concerns.  If you have a medical emergency, go to the nearest emergency room or call 911.  A surgeon from [facility] is always on call at the hospital. [address], [address], [place], [place]  [address] ? [address] [address], [place], [place]   [address] [phone] ? [phone] ? FAX [phone] Web site: [email or web address]  .........   Managing Your Pain After Surgery Without Opioids    Thank you for participating in our program to help patients manage their pain after surgery without opioids. This is part of our effort to provide you with the best care possible, without exposing you or your family to the risk that opioids pose.  What pain can I expect after surgery? You can expect to have some pain after surgery. This is normal. The pain is typically worse the day after surgery, and quickly begins to get better. Many studies have found that many patients are able to manage their pain after surgery with Over-the-Counter (OTC) medications such as Tylenol and Motrin. If you have a condition that does not allow you to take Tylenol or Motrin, notify your surgical team.  How will I manage my pain? The best strategy for controlling your pain after surgery is around the clock pain control with Tylenol (acetaminophen) and Motrin (ibuprofen or Advil). Alternating these medications with each other allows you to maximize your pain control. In addition to Tylenol and Motrin, you can use heating pads or ice packs on your incisions to help reduce your pain.  How will I alternate your regular strength over-the-counter pain medication? You will take a dose of pain medication every three hours. ; Start by taking 650 mg of Tylenol (2 pills of 325 mg) ; 3 hours later take 600 mg of Motrin (3 pills of 200 mg) ; 3 hours after taking the Motrin take 650 mg of Tylenol ; 3 hours after that take 600 mg of  Motrin.   - 1 -  See example - if your first dose of Tylenol is at 12:00 PM   12:00 PM Tylenol 650 mg (2 pills of 325 mg)  3:00 PM Motrin 600 mg (3 pills of 200 mg)  6:00 PM Tylenol 650 mg (2 pills of 325 mg)  9:00 PM Motrin 600 mg (3 pills of 200 mg)  Continue alternating every 3 hours   We recommend that you follow this schedule around-the-clock for at least 3 days after surgery, or until you feel that it is no longer needed. Use the table on the last page of this handout to keep track of the medications you are taking. Important: Do not take more than 3000mg of Tylenol or 3200mg of Motrin in a 24-hour period. Do not take ibuprofen/Motrin if you have a history of bleeding stomach ulcers, severe kidney disease, &/or actively taking a blood thinner  What if I still have pain? If you have pain that is not controlled with the over-the-counter pain medications (Tylenol and Motrin or Advil) you might have what we call "breakthrough" pain. You will receive a prescription for a small amount of an opioid pain medication such as   Oxycodone, Tramadol, or Tylenol with Codeine. Use these opioid pills in the first 24 hours after surgery if you have breakthrough pain. Do not take more than 1 pill every 4-6 hours.  If you still have uncontrolled pain after using all opioid pills, don't hesitate to call our staff using the number provided. We will help make sure you are managing your pain in the best way possible, and if necessary, we can provide a prescription for additional pain medication.   Day 1    Time  Name of Medication Number of pills taken  Amount of Acetaminophen  Pain Level   Comments  AM PM       AM PM       AM PM       AM PM       AM PM       AM PM       AM PM       AM PM       Total Daily amount of Acetaminophen Do not take more than  3,000 mg per day      Day 2    Time  Name of Medication Number of pills taken  Amount of Acetaminophen  Pain Level   Comments  AM  PM       AM PM       AM PM       AM PM       AM PM       AM PM       AM PM       AM PM       Total Daily amount of Acetaminophen Do not take more than  3,000 mg per day      Day 3    Time  Name of Medication Number of pills taken  Amount of Acetaminophen  Pain Level   Comments  AM PM       AM PM       AM PM       AM PM          AM PM       AM PM       AM PM       AM PM       Total Daily amount of Acetaminophen Do not take more than  3,000 mg per day      Day 4    Time  Name of Medication Number of pills taken  Amount of Acetaminophen  Pain Level   Comments  AM PM       AM PM       AM PM       AM PM       AM PM       AM PM       AM PM       AM PM       Total Daily amount of Acetaminophen Do not take more than  3,000 mg per day      Day 5    Time  Name of Medication Number of pills taken  Amount of Acetaminophen  Pain Level   Comments  AM PM       AM PM       AM PM       AM PM       AM PM       AM PM       AM PM         AM PM       Total Daily amount of Acetaminophen Do not take more than  3,000 mg per day       Day 6    Time  Name of Medication Number of pills taken  Amount of Acetaminophen  Pain Level  Comments  AM PM       AM PM       AM PM       AM PM       AM PM       AM PM       AM PM       AM PM       Total Daily amount of Acetaminophen Do not take more than  3,000 mg per day      Day 7    Time  Name of Medication Number of pills taken  Amount of Acetaminophen  Pain Level   Comments  AM PM       AM PM       AM PM       AM PM       AM PM       AM PM       AM PM       AM PM       Total Daily amount of Acetaminophen Do not take more than  3,000 mg per day        For additional information about how and where to safely dispose of unused opioid medications - https://www.morepowerfulnc.org  Disclaimer: This document contains information and/or instructional materials adapted from [facility]  for the typical patient with your condition. It does not replace medical advice from your health care provider because your experience may differ from that of the typical patient. Talk to your health care provider if you have any questions about this document, your condition or your treatment plan. Adapted from [facility]  

## 2019-12-07 LAB — SURGICAL PATHOLOGY

## 2020-02-13 ENCOUNTER — Other Ambulatory Visit: Payer: Self-pay | Admitting: Gastroenterology

## 2020-02-13 DIAGNOSIS — K839 Disease of biliary tract, unspecified: Secondary | ICD-10-CM

## 2020-02-13 DIAGNOSIS — R748 Abnormal levels of other serum enzymes: Secondary | ICD-10-CM

## 2020-02-13 DIAGNOSIS — R935 Abnormal findings on diagnostic imaging of other abdominal regions, including retroperitoneum: Secondary | ICD-10-CM

## 2020-03-07 ENCOUNTER — Ambulatory Visit
Admission: RE | Admit: 2020-03-07 | Discharge: 2020-03-07 | Disposition: A | Discharge status: Home / Self Care | Payer: BC Managed Care – PPO | Source: Ambulatory Visit | Attending: Gastroenterology | Admitting: Gastroenterology

## 2020-03-07 ENCOUNTER — Other Ambulatory Visit: Payer: Self-pay

## 2020-03-07 DIAGNOSIS — R748 Abnormal levels of other serum enzymes: Secondary | ICD-10-CM

## 2020-03-07 DIAGNOSIS — K839 Disease of biliary tract, unspecified: Secondary | ICD-10-CM

## 2020-03-07 DIAGNOSIS — R935 Abnormal findings on diagnostic imaging of other abdominal regions, including retroperitoneum: Secondary | ICD-10-CM

## 2020-03-07 IMAGING — MR MR ABDOMEN WO/W CM MRCP
12 of 20 series · 24 of 48 positions shown · IV contrast (multihance)
Comparison: MRI [DATE]
COMPARISON: MRI [DATE]

Addendum:
CLINICAL DATA: Previous cholecystectomy. Patient reports having to
go to the restroom minutes after eating.

EXAM:
MRI ABDOMEN WITHOUT AND WITH CONTRAST (INCLUDING MRCP)
TECHNIQUE: Multiplanar multisequence MR imaging of the abdomen was performed
both before and after the administration of intravenous contrast.
Heavily T2-weighted images of the biliary and pancreatic ducts were
obtained, and three-dimensional MRCP images were rendered by post
processing.
CONTRAST:  18mL MULTIHANCE GADOBENATE DIMEGLUMINE 529 MG/ML IV SOLN

[Series 3: T2 · axial · 6.0mm · 0.70mm/px · z∈[-94,+141]mm · 2 of 35 slices shown (1 of 3)]
[im 1/35]
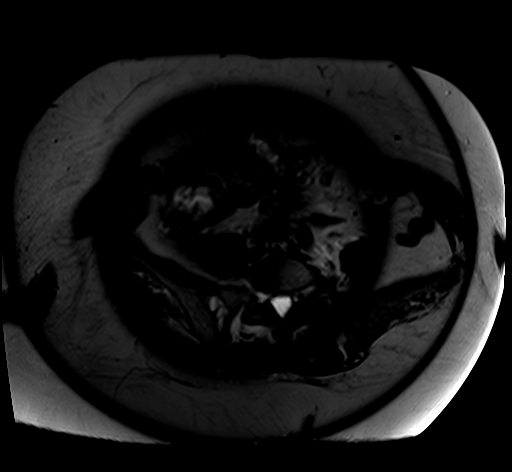
[im 35/35]
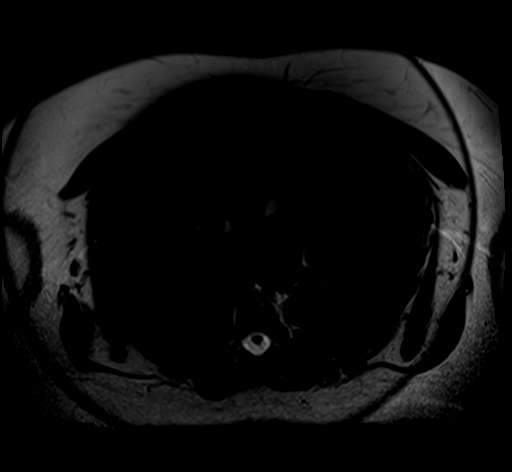

[Series 4: T2 · coronal · 5.0mm · 1.41mm/px · 2 of 35 slices shown (2 of 3)]
[im 1/35]
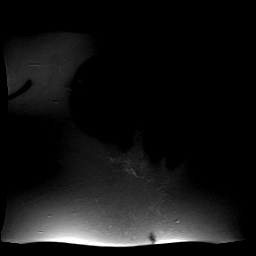
[im 35/35]
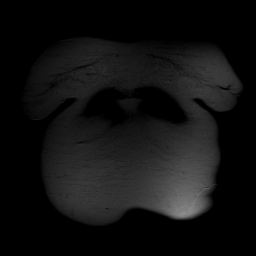

[Series 5: T2 · axial · 6.0mm · 1.56mm/px · z∈[-92,+115]mm · 2 of 31 slices shown (3 of 3)]
[im 1/31]
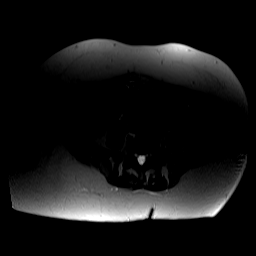
[im 31/31]
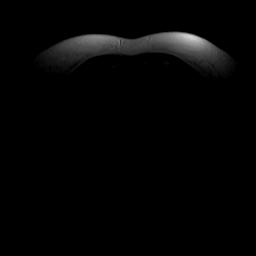

[Series 7: ep2d_[id number]_500_800_p2 · axial · 6.0mm · 1.88mm/px · z∈[-63,+137]mm · 3 of 90 slices shown]
[im 1/90]
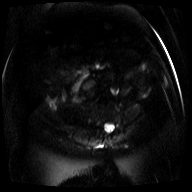
[im 45/90]
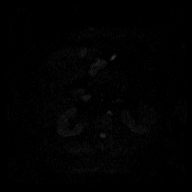
[im 90/90]
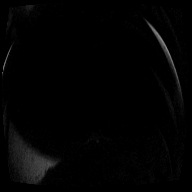

[Series 8: [id number]_adc · axial · 6.0mm · 1.88mm/px · 1 of 30 slices shown]
[im 1/30]
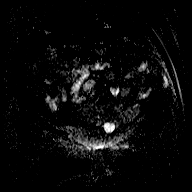

[Series 9: axial in out · axial · 6.0mm · 0.78mm/px · z∈[-90,+124]mm · 2 of 64 slices shown]
[im 1/64]
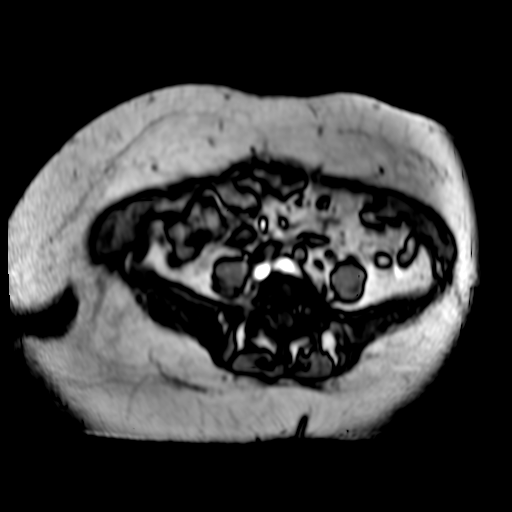
[im 64/64]
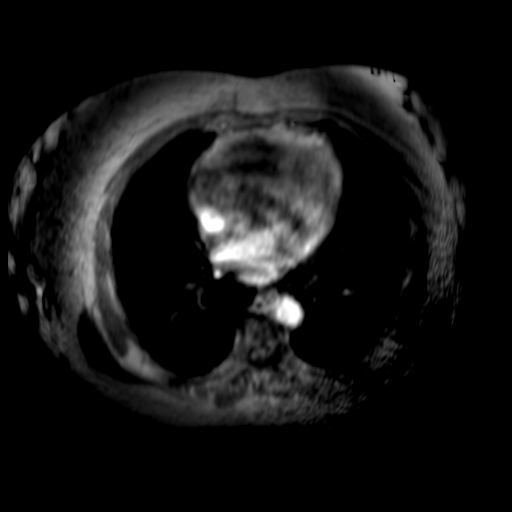

[Series 12: cor haste fs · coronal · 3.0mm · 0.70mm/px · 1 of 22 slices shown]
[im 1/22]
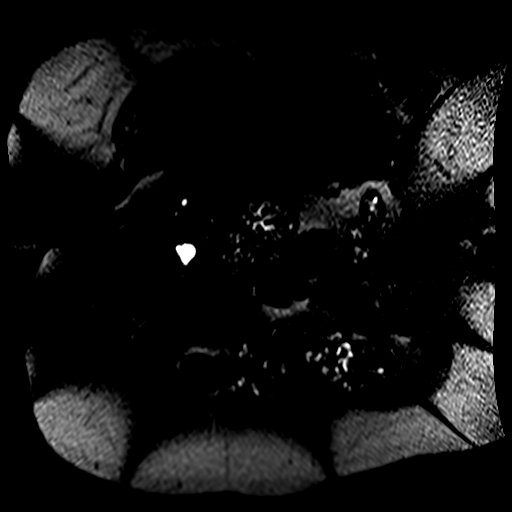

[Series 13: axial tru fisp · axial · 3.5mm · 1.56mm/px · z∈[-42,+123]mm · 2 of 42 slices shown]
[im 1/42]
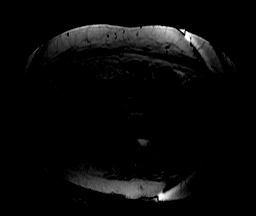
[im 42/42]
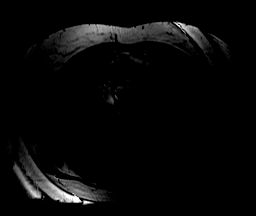

[Series 14: t2_haste_fs_thick obl · sagittal · 60.0mm · 0.99mm/px · 1 of 6 slices shown]
[im 1/6]
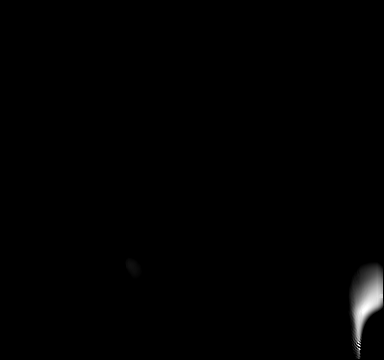

[Series 15: T1 dynamic · axial · non-contrast · 2.5mm · 0.78mm/px · z∈[-92,+125]mm · 3 of 88 slices shown]
[im 1/88]
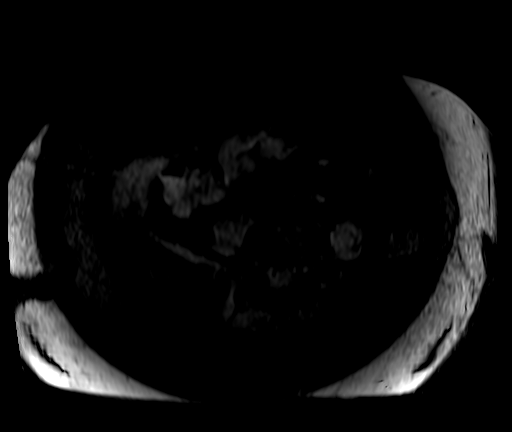
[im 44/88]
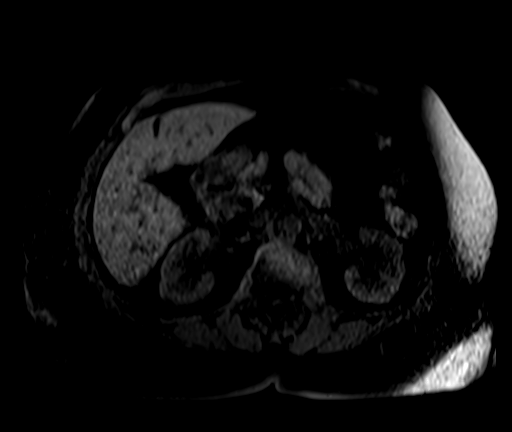
[im 88/88]
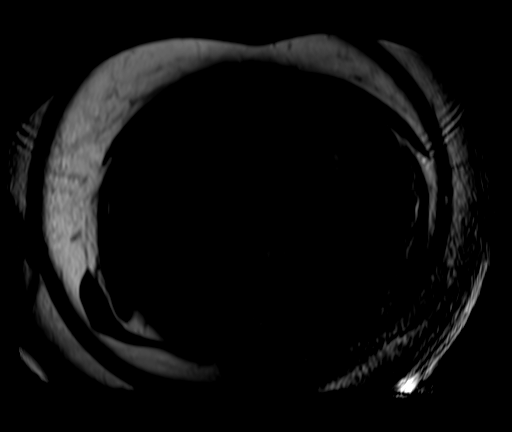

[Series 16: post 25 sec · axial · 2.5mm · 0.78mm/px · z∈[-92,+125]mm · 3 of 88 slices shown]
[im 1/88]
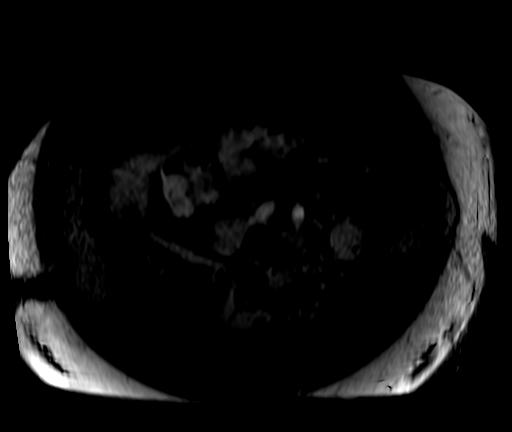
[im 44/88]
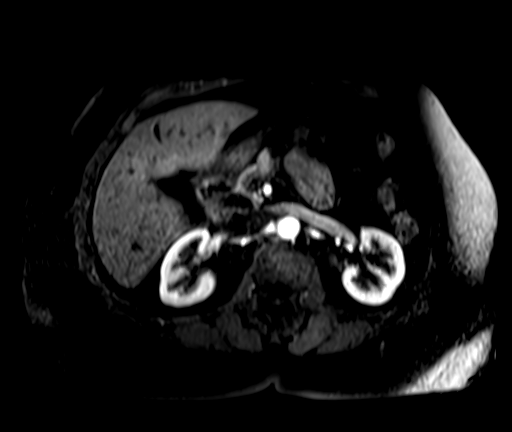
[im 88/88]
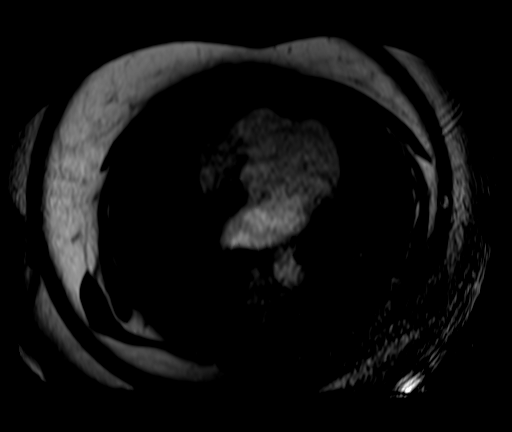

[Series 17: post 25 sec_sub · axial · 2.5mm · 0.78mm/px · z∈[-92,+15]mm · 2 of 88 slices shown]
[im 1/88]
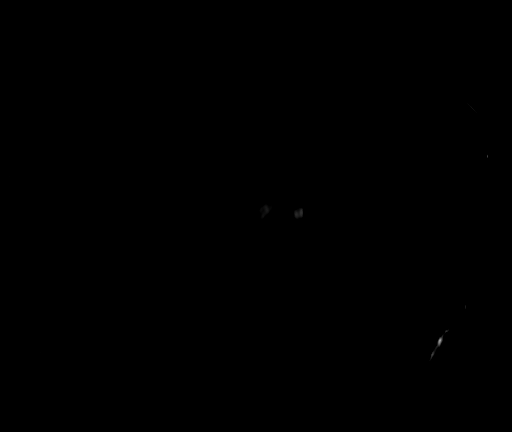
[im 44/88]
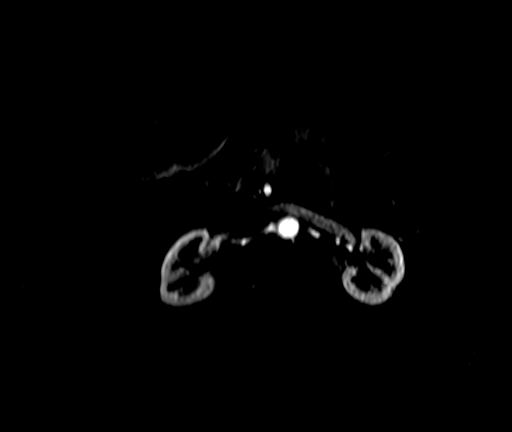

[24 of 48 positions shown; findings below may reference images not displayed]

FINDINGS: Lower chest: No acute abnormality.

Hepatobiliary: Mild hepatic steatosis. Multiple simple appearing
liver cysts are identified which appear similar to the previous
exam. No arterial phase enhancing liver lesions identified. No
suspicious enhancing lesions on the delayed phase images. Interval
cholecystectomy. There is progressive intrahepatic bile duct
dilatation and common bile duct dilatation. Fusiform dilatation of
the common bile duct to the level of the ampulla is noted. The CBD
measures up to 1.1 cm, image [DATE]. Previously 0.7 cm. No
choledocholithiasis identified.

Pancreas: No mass, inflammatory changes, or other parenchymal
abnormality identified.

Spleen:  Within normal limits in size and appearance.

Adrenals/Urinary Tract: Normal appearance of the adrenal glands. No
kidney mass or hydronephrosis identified bilaterally.

Stomach/Bowel: The stomach appears nondistended. There is prominent
wall thickening along the greater curvature of the stomach, image
[DATE]. No discrete mass noted. In contrast with the previous exam
most of the nondilated jejunal loops are seen within the right
abdomen beyond the level of the ligament of Treitz. There is also
swirling of the central mesenteric vessels and a counter clockwise
configuration which is new from the previous study and is suggestive
of malrotation variant. No signs of midgut volvulus.

Vascular/Lymphatic: Normal appearance of the abdominal aorta. The
portal vein, splenic vein and superior mesenteric vein remain
patent. No abdominal adenopathy identified.

Other:  No free fluid identified

Musculoskeletal: No suspicious bone lesions identified.
IMPRESSION: 1. Interval cholecystectomy. There is progressive intrahepatic and
common bile duct dilatation to the level of the ampulla. No
choledocholithiasis identified.
2. There is prominent wall thickening along the greater curvature of
the stomach. No discrete mass noted. Consider further investigation
with upper endoscopy to assess for either hypertrophic gastritis or
underlying gastric lesion
3. In contrast to the previous exam most of the jejunal loops are
noted within the right abdomen beyond the level of the ligament of
Treitz. Further there is a counter clockwise swirling pattern of the
mesenteric vessels which is new from previous exam suggesting
malrotation variant. No sign of midgut volvulus.
4. Mild hepatic steatosis.
5. Multiple liver cyst appear similar to previous exam.

ADDENDUM:
At the request of the ordering provider the current study was
reviewed and compared with the MRI from [DATE].

Signs of persistent and progressive dilatation of the left and right
hepatic ducts noted: High

-The proximal left hepatic duct has a approximate diameter of 9 mm
on the current exam, image [DATE]. This is compared with 6 mm
previously.

-The proximal right hepatic duct has a diameter of 0.9 cm, image
[DATE]. Previously 0.5 cm. The previously noted focal narrowing of the
common hepatic duct has resolved. The proximal common bile duct,
just beyond the confluence measures 1.3 cm in maximum dimension. On
the previous exam the common bile duct had a maximum diameter of 7
mm.

Previously described upper abdominal adenopathy:

-Index portacaval node measures 1 cm in short axis, image [DATE].
Previously 1.2 cm.

-Index left retrocrural lymph node measures 0.4 cm, image 38/16.
Previously this was measured at 0.8 cm.

-Index porta hepatic node measures 0.8 cm, image [DATE]. Previously
1.3 cm.

*** End of Addendum ***
FINDINGS: Lower chest: No acute abnormality.

Hepatobiliary: Mild hepatic steatosis. Multiple simple appearing
liver cysts are identified which appear similar to the previous
exam. No arterial phase enhancing liver lesions identified. No
suspicious enhancing lesions on the delayed phase images. Interval
cholecystectomy. There is progressive intrahepatic bile duct
dilatation and common bile duct dilatation. Fusiform dilatation of
the common bile duct to the level of the ampulla is noted. The CBD
measures up to 1.1 cm, image [DATE]. Previously 0.7 cm. No
choledocholithiasis identified.

Pancreas: No mass, inflammatory changes, or other parenchymal
abnormality identified.

Spleen:  Within normal limits in size and appearance.

Adrenals/Urinary Tract: Normal appearance of the adrenal glands. No
kidney mass or hydronephrosis identified bilaterally.

Stomach/Bowel: The stomach appears nondistended. There is prominent
wall thickening along the greater curvature of the stomach, image
[DATE]. No discrete mass noted. In contrast with the previous exam
most of the nondilated jejunal loops are seen within the right
abdomen beyond the level of the ligament of Treitz. There is also
swirling of the central mesenteric vessels and a counter clockwise
configuration which is new from the previous study and is suggestive
of malrotation variant. No signs of midgut volvulus.

Vascular/Lymphatic: Normal appearance of the abdominal aorta. The
portal vein, splenic vein and superior mesenteric vein remain
patent. No abdominal adenopathy identified.

Other:  No free fluid identified

Musculoskeletal: No suspicious bone lesions identified.
IMPRESSION: 1. Interval cholecystectomy. There is progressive intrahepatic and
common bile duct dilatation to the level of the ampulla. No
choledocholithiasis identified.
2. There is prominent wall thickening along the greater curvature of
the stomach. No discrete mass noted. Consider further investigation
with upper endoscopy to assess for either hypertrophic gastritis or
underlying gastric lesion
3. In contrast to the previous exam most of the jejunal loops are
noted within the right abdomen beyond the level of the ligament of
Treitz. Further there is a counter clockwise swirling pattern of the
mesenteric vessels which is new from previous exam suggesting
malrotation variant. No sign of midgut volvulus.
4. Mild hepatic steatosis.
5. Multiple liver cyst appear similar to previous exam.

## 2020-03-07 MED ORDER — GADOBENATE DIMEGLUMINE 529 MG/ML IV SOLN
18.0000 mL | Freq: Once | INTRAVENOUS | Status: AC | PRN
  Administered 2020-03-07: 18 mL via INTRAVENOUS
